# Patient Record
Sex: Male | Born: 1945
Health system: Southern US, Community
[De-identification: ages and names within clinical notes are randomized; demographics above are authoritative.]

## PROBLEM LIST (undated history)

## (undated) DIAGNOSIS — I864 Gastric varices: Secondary | ICD-10-CM

## (undated) DIAGNOSIS — F1911 Other psychoactive substance abuse, in remission: Secondary | ICD-10-CM

## (undated) DIAGNOSIS — Z89619 Acquired absence of unspecified leg above knee: Secondary | ICD-10-CM

## (undated) DIAGNOSIS — G8220 Paraplegia, unspecified: Secondary | ICD-10-CM

## (undated) HISTORY — DX: Acquired absence of unspecified leg above knee: Z89.619

## (undated) HISTORY — DX: Other psychoactive substance abuse, in remission: F19.11

## (undated) HISTORY — DX: Gastric varices: I86.4

## (undated) HISTORY — PX: PENILE PROSTHESIS IMPLANT: SHX240

## (undated) HISTORY — DX: Paraplegia, unspecified: G82.20

## (undated) HISTORY — PX: SPINE SURGERY: SHX786

## (undated) HISTORY — PX: AMPUTATION: SHX166

---

## 2006-06-17 ENCOUNTER — Ambulatory Visit: Payer: Self-pay | Admitting: Family Medicine

## 2006-09-10 ENCOUNTER — Ambulatory Visit: Payer: Self-pay | Admitting: Family Medicine

## 2011-05-07 ENCOUNTER — Ambulatory Visit (INDEPENDENT_AMBULATORY_CARE_PROVIDER_SITE_OTHER): Payer: Medicare Other | Admitting: Surgery

## 2012-11-25 ENCOUNTER — Telehealth: Payer: Self-pay | Admitting: Family Medicine

## 2012-11-25 NOTE — Telephone Encounter (Addendum)
[place] [facility] [place]  He had osteomyelitis and had his L leg amputated and his right 5th toe. Patient is a paraplegic. He has a f/u with the medical center next week.  Hasn't been seen here since 12/2011 and he was a patient of Helene Kelp, PA-C.   He needs 18g French Indwelling Foley Catheter #6 sent to [facility] in [place].  Phone number [phone].

## 2012-11-25 NOTE — Telephone Encounter (Signed)
Unable to send electronically because [facility] in [place] is not listed.  Catheters called in.  They will contact patient.

## 2012-11-25 NOTE — Telephone Encounter (Signed)
Please take care of this catheter needs until he is able to see a provider) in the back

## 2013-01-26 ENCOUNTER — Encounter: Payer: Self-pay | Admitting: Family Medicine

## 2013-01-26 ENCOUNTER — Encounter (INDEPENDENT_AMBULATORY_CARE_PROVIDER_SITE_OTHER): Payer: Self-pay

## 2013-01-26 ENCOUNTER — Ambulatory Visit (INDEPENDENT_AMBULATORY_CARE_PROVIDER_SITE_OTHER): Payer: Medicare Other | Admitting: Family Medicine

## 2013-01-26 VITALS — BP 148/82 | HR 65 | Temp 97.8°F | Ht 71.0 in | Wt 164.0 lb

## 2013-01-26 DIAGNOSIS — B353 Tinea pedis: Secondary | ICD-10-CM

## 2013-01-26 DIAGNOSIS — R319 Hematuria, unspecified: Secondary | ICD-10-CM

## 2013-01-26 LAB — POCT UA - MICROSCOPIC ONLY
Casts, Ur, LPF, POC: NEGATIVE
Mucus, UA: NEGATIVE
Yeast, UA: NEGATIVE

## 2013-01-26 LAB — POCT URINALYSIS DIPSTICK
Bilirubin, UA: NEGATIVE
Glucose, UA: NEGATIVE
Ketones, UA: NEGATIVE
Nitrite, UA: POSITIVE
Protein, UA: NEGATIVE
Spec Grav, UA: <=1.005
Urobilinogen, UA: NEGATIVE
pH, UA: 8

## 2013-01-26 MED ORDER — FLUCONAZOLE 150 MG PO TABS: 150.0000 mg | ORAL_TABLET | Freq: Once | ORAL | Status: DC

## 2013-01-26 MED ORDER — CIPROFLOXACIN HCL 500 MG PO TABS: 500.0000 mg | ORAL_TABLET | Freq: Two times a day (BID) | ORAL | Status: DC

## 2013-01-26 NOTE — Patient Instructions (Signed)
Athlete's Foot Athlete's foot (tinea pedis) is a fungal infection of the skin on the feet. It often occurs on the skin between the toes or underneath the toes. It can also occur on the soles of the feet. Athlete's foot is more likely to occur in hot, humid weather. Not washing your feet or changing your socks often enough can contribute to athlete's foot. The infection can spread from person to person (contagious). CAUSES Athlete's foot is caused by a fungus. This fungus thrives in warm, moist places. Most people get athlete's foot by sharing shower stalls, towels, and wet floors with an infected person. People with weakened immune systems, including those with diabetes, may be more likely to get athlete's foot. SYMPTOMS   Itchy areas between the toes or on the soles of the feet.  White, flaky, or scaly areas between the toes or on the soles of the feet.  Tiny, intensely itchy blisters between the toes or on the soles of the feet.  Tiny cuts on the skin. These cuts can develop a bacterial infection.  Thick or discolored toenails. DIAGNOSIS  Your caregiver can usually tell what the problem is by doing a physical exam. Your caregiver may also take a skin sample from the rash area. The skin sample may be examined under a microscope, or it may be tested to see if fungus will grow in the sample. A sample may also be taken from your toenail for testing. TREATMENT  Over-the-counter and prescription medicines can be used to kill the fungus. These medicines are available as powders or creams. Your caregiver can suggest medicines for you. Fungal infections respond slowly to treatment. You may need to continue using your medicine for several weeks. PREVENTION   Do not share towels.  Wear sandals in wet areas, such as shared locker rooms and shared showers.  Keep your feet dry. Wear shoes that allow air to circulate. Wear cotton or wool socks. HOME CARE INSTRUCTIONS   Take medicines as directed by  your caregiver. Do not use steroid creams on athlete's foot.  Keep your feet clean and cool. Wash your feet daily and dry them thoroughly, especially between your toes.  Change your socks every day. Wear cotton or wool socks. In hot climates, you may need to change your socks 2 to 3 times per day.  Wear sandals or canvas tennis shoes with good air circulation.  If you have blisters, soak your feet in Burow's solution or Epsom salts for 20 to 30 minutes, 2 times a day to dry out the blisters. Make sure you dry your feet thoroughly afterward. SEEK MEDICAL CARE IF:   You have a fever.  You have swelling, soreness, warmth, or redness in your foot.  You are not getting better after 7 days of treatment.  You are not completely cured after 30 days.  You have any problems caused by your medicines. MAKE SURE YOU:   Understand these instructions.  Will watch your condition.  Will get help right away if you are not doing well or get worse. Document Released: 03/22/2000 Document Revised: 06/17/2011 Document Reviewed: 01/11/2011 Exit[employer] Patient Information 2014 [employer].  

## 2013-01-26 NOTE — Progress Notes (Signed)
  Subjective:    Patient ID: Jason Clayton, male    DOB: [birth year], 67 y.o.   MRN: [id number]  HPI  This 67 y.o. male presents for evaluation of possible UTI and left foot wound at his 4th and 3rd toe. He is scheduled to see a podiatrist with the [place] in [place].  He has indwelling foley cath.  He has  Hx of paraplegia.  He has had AKA left LE.  He has hx of neurogenic bladder and was using condom Cath and then went to foley and he changes the foley and leg bag once a week.  He has had hx Of decubitus ulcers and has been operated on 7 times over the years.  Review of Systems C/o left foot wound and urinary sx's.   No chest pain, SOB, HA, dizziness, vision change, N/V, diarrhea, constipation or rash.  Objective:   Physical Exam Vital signs noted  Well developed well nourished male.  HEENT - Head atraumatic Normocephalic                Eyes - PERRLA, Conjuctiva - clear Sclera- Clear EOMI                Ears - EAC's Wnl TM's Wnl Gross Hearing WNL                Nose - Nares patent                 Throat - oropharanx wnl Respiratory - Lungs CTA bilateral Cardiac - RRR S1 and S2 without murmur GI - Abdomen soft Nontender and bowel sounds active x 4 Extremities - No edema. Neuro - Grossly intact.  Results for orders placed in visit on 01/26/13  POCT UA - MICROSCOPIC ONLY      Result Value Range   WBC, Ur, HPF, POC 10-15     RBC, urine, microscopic 1-5     Bacteria, U Microscopic mod     Mucus, UA neg     Epithelial cells, urine per micros occ     Crystals, Ur, HPF, POC mod     Casts, Ur, LPF, POC neg     Yeast, UA neg    POCT URINALYSIS DIPSTICK      Result Value Range   Color, UA yellow     Clarity, UA cloudy     Glucose, UA neg     Bilirubin, UA neg     Ketones, UA neg     Spec Grav, UA <=1.005     Blood, UA trace     pH, UA 8.0     Protein, UA neg     Urobilinogen, UA negative     Nitrite, UA pos     Leukocytes, UA large (3+)         Assessment & Plan:   Blood in urine - Plan: POCT UA - Microscopic Only, POCT urinalysis dipstick, ciprofloxacin (CIPRO) 500 MG tablet  Tinea pedis - Plan: fluconazole (DIFLUCAN) 150 MG tablet  Deatra Canter FNP

## 2013-01-29 ENCOUNTER — Telehealth: Payer: Self-pay | Admitting: Family Medicine

## 2013-02-02 ENCOUNTER — Telehealth: Payer: Self-pay | Admitting: Family Medicine

## 2013-02-02 ENCOUNTER — Other Ambulatory Visit: Payer: Self-pay | Admitting: Family Medicine

## 2013-02-02 DIAGNOSIS — B353 Tinea pedis: Secondary | ICD-10-CM

## 2013-02-02 MED ORDER — FLUCONAZOLE 150 MG PO TABS: 150.0000 mg | ORAL_TABLET | Freq: Once | ORAL | Status: DC

## 2013-02-17 NOTE — Telephone Encounter (Signed)
Spoke with pt and he says "they need not send specimen off for culture but he did complete cipro and now reports he is not having any problems. Advised to call if symptoms occur. Pt verbalized understanidng

## 2013-02-23 ENCOUNTER — Encounter: Payer: Self-pay | Admitting: Family Medicine

## 2013-02-23 ENCOUNTER — Ambulatory Visit (INDEPENDENT_AMBULATORY_CARE_PROVIDER_SITE_OTHER): Payer: Medicare Other | Admitting: Family Medicine

## 2013-02-23 VITALS — BP 146/86 | HR 83 | Temp 97.8°F | Ht 71.0 in | Wt 160.0 lb

## 2013-02-23 DIAGNOSIS — J329 Chronic sinusitis, unspecified: Secondary | ICD-10-CM

## 2013-02-23 DIAGNOSIS — J209 Acute bronchitis, unspecified: Secondary | ICD-10-CM

## 2013-02-23 MED ORDER — AMOXICILLIN-POT CLAVULANATE 875-125 MG PO TABS: 1.0000 | ORAL_TABLET | Freq: Two times a day (BID) | ORAL | Status: DC

## 2013-02-23 MED ORDER — HYDROCODONE-HOMATROPINE 5-1.5 MG/5ML PO SYRP: 5.0000 mL | ORAL_SOLUTION | Freq: Four times a day (QID) | ORAL | Status: DC | PRN

## 2013-02-23 MED ORDER — METHYLPREDNISOLONE (PAK) 4 MG PO TABS: ORAL_TABLET | ORAL | Status: DC

## 2013-02-23 NOTE — Patient Instructions (Signed)

## 2013-02-23 NOTE — Progress Notes (Signed)
  Subjective:    Patient ID: COBE VINEY, male    DOB: 1946/03/27, 67 y.o.   MRN: [id number]  URI  This is a new problem. The current episode started 1 to 4 weeks ago (for last week and half). The problem has been gradually worsening. There has been no fever. The fever has been present for less than 1 day. Associated symptoms include congestion, coughing, headaches, rhinorrhea and sneezing. Pertinent negatives include no nausea, plugged ear sensation, sinus pain or wheezing. He has tried decongestant, antihistamine and NSAIDs for the symptoms. The treatment provided mild relief.      Review of Systems  HENT: Positive for congestion, rhinorrhea and sneezing.   Respiratory: Positive for cough. Negative for wheezing.   Gastrointestinal: Negative for nausea.  Neurological: Positive for headaches.  All other systems reviewed and are negative.       Objective:   Physical Exam  Vitals reviewed. Constitutional: He is oriented to person, place, and time. He appears well-developed and well-nourished.  HENT:  Right Ear: External ear normal.  Left Ear: External ear normal.  Nose: Rhinorrhea present. Right sinus exhibits no maxillary sinus tenderness and no frontal sinus tenderness. Left sinus exhibits no maxillary sinus tenderness and no frontal sinus tenderness.  Eyes: Pupils are equal, round, and reactive to light.  Cardiovascular: Normal rate, regular rhythm, normal heart sounds and intact distal pulses.   Pulmonary/Chest: Effort normal. He has wheezes.  Productive coarse cough  Neurological: He is alert and oriented to person, place, and time.  Psychiatric: He has a normal mood and affect. His behavior is normal. Judgment and thought content normal.      BP 146/86  Pulse 83  Temp(Src) 97.8 F (36.6 C) (Oral)  Ht 5\' 11"  (1.803 m)  Wt 160 lb (72.576 kg)  BMI 22.33 kg/m2     Assessment & Plan:  Sinusitis - Plan: amoxicillin-clavulanate (AUGMENTIN) 875-125 MG per tablet,  methylPREDNIsolone (MEDROL DOSPACK) 4 MG tablet, HYDROcodone-homatropine (HYCODAN) 5-1.5 MG/5ML syrup  Acute bronchitis - Plan: amoxicillin-clavulanate (AUGMENTIN) 875-125 MG per tablet, methylPREDNIsolone (MEDROL DOSPACK) 4 MG tablet, HYDROcodone-homatropine (HYCODAN) 5-1.5 MG/5ML syrup.   Deatra Canter FNP

## 2013-03-18 ENCOUNTER — Ambulatory Visit (INDEPENDENT_AMBULATORY_CARE_PROVIDER_SITE_OTHER): Payer: Medicare Other | Admitting: Family Medicine

## 2013-03-18 ENCOUNTER — Encounter: Payer: Self-pay | Admitting: Family Medicine

## 2013-03-18 ENCOUNTER — Telehealth: Payer: Self-pay | Admitting: Family Medicine

## 2013-03-18 VITALS — BP 147/83 | HR 61 | Temp 98.5°F

## 2013-03-18 DIAGNOSIS — R319 Hematuria, unspecified: Secondary | ICD-10-CM

## 2013-03-18 DIAGNOSIS — R109 Unspecified abdominal pain: Secondary | ICD-10-CM

## 2013-03-18 MED ORDER — SULFAMETHOXAZOLE-TMP DS 800-160 MG PO TABS: 1.0000 | ORAL_TABLET | Freq: Two times a day (BID) | ORAL | Status: DC

## 2013-03-18 MED ORDER — HYOSCYAMINE SULFATE 0.125 MG SL SUBL: 0.1250 mg | SUBLINGUAL_TABLET | SUBLINGUAL | Status: DC | PRN

## 2013-03-18 NOTE — Progress Notes (Signed)
   Subjective:    Patient ID: Jason Clayton, male    DOB: [birth year], 67 y.o.   MRN: [id number]  HPI This 67 y.o. male presents for evaluation of abdominal pain and bowel spasms.  He has Hx of paraplegia and left AKA.  He has been doing self digital stimulation to have BM and he Notes his stools are firm.  He states he has been having lower abdominal discomfort.  He Has indwelling foley catheter and has been having some change in his urine which Now is cloudy and has sediment in it.  He has been feeling lethargic.  He has an ulcer on His right 4th and 3rd toes and he is seeing podiatry at the [place] in [place].   Review of Systems C/o constipation, lower abdominal discomfort, and lethargy. No chest pain, SOB, HA, dizziness, vision change, N/V, diarrhea, constipation, dysuria, urinary urgency or frequency, myalgias, arthralgias or rash.     Objective:   Physical Exam  Vital signs noted  Well developed well nourished male.  HEENT - Head atraumatic Normocephalic                Eyes - PERRLA, Conjuctiva - clear Sclera- Clear EOMI                Ears - EAC's Wnl TM's Wnl Gross Hearing WNL                Throat - oropharanx wnl Respiratory - Lungs CTA bilateral Cardiac - RRR S1 and S2 without murmur GI - Abdomen soft tenderness at umbilicus but no guarding and bowel sounds active x 4 Extremities - Left AKA       Assessment & Plan:  Blood in urine - Plan: sulfamethoxazole-trimethoprim (BACTRIM DS) 800-160 MG per tablet  Abdominal  pain, other specified site - Plan: hyoscyamine (LEVSIN/SL) 0.125 MG SL tablet  Constipation - Linzess samples given  Deatra Canter FNP

## 2013-03-18 NOTE — Telephone Encounter (Signed)
appt scheduled

## 2013-03-18 NOTE — Patient Instructions (Signed)

## 2013-03-19 ENCOUNTER — Ambulatory Visit: Payer: Medicare Other | Admitting: Family Medicine

## 2013-04-12 DIAGNOSIS — T1490XA Injury, unspecified, initial encounter: Secondary | ICD-10-CM | POA: Diagnosis not present

## 2013-05-07 ENCOUNTER — Encounter: Payer: Self-pay | Admitting: Family Medicine

## 2013-05-07 ENCOUNTER — Ambulatory Visit (INDEPENDENT_AMBULATORY_CARE_PROVIDER_SITE_OTHER): Payer: Medicare Other | Admitting: Family Medicine

## 2013-05-07 VITALS — BP 128/70 | HR 64 | Temp 97.8°F | Ht 71.0 in

## 2013-05-07 DIAGNOSIS — G8929 Other chronic pain: Secondary | ICD-10-CM

## 2013-05-07 DIAGNOSIS — R1013 Epigastric pain: Secondary | ICD-10-CM

## 2013-05-07 MED ORDER — SUCRALFATE 1 G PO TABS: 1.0000 g | ORAL_TABLET | Freq: Three times a day (TID) | ORAL | Status: DC

## 2013-05-07 MED ORDER — SUCRALFATE 1 GM/10ML PO SUSP: 1.0000 g | Freq: Three times a day (TID) | ORAL | Status: DC

## 2013-05-07 NOTE — Patient Instructions (Signed)

## 2013-05-07 NOTE — Progress Notes (Signed)
   Subjective:    Patient ID: Jason Clayton, male    DOB: 05/06/1945, 68 y.o.   MRN: [id number]  HPI This 68 y.o. male presents for evaluation of abdominal pain.  He has been taking xtra Methadone which is rx[facility] and he wants me to give him some more to bridge Until next week.   Review of Systems C/o abdominal pain No chest pain, SOB, HA, dizziness, vision change, N/V, diarrhea, constipation, dysuria, urinary urgency or frequency, myalgias, arthralgias or rash.     Objective:   Physical Exam Vital signs noted  Well developed well nourished male.  HEENT - Head atraumatic Normocephalic                Eyes - PERRLA, Conjuctiva - clear Sclera- Clear EOMI                Ears - EAC's Wnl TM's Wnl Gross Hearing WNL                Nose - Nares patent                 Throat - oropharanx wnl Respiratory - Lungs CTA bilateral Cardiac - RRR S1 and S2 without murmur GI - Abdomen soft tender epigastric region and bowel sounds active x 4 Extremities - Left AKA       Assessment & Plan:  Abdominal pain, chronic, epigastric - Plan: sucralfate (CARAFATE) 1 G tablet Po ac and hs x 2 weeks.  Denied methadone and explained he needs to go to his pain Management doctor who rx's this and does not need to have multiple prescribers of pain medicine.  Deatra CanterWilliam J Oxford FNP

## 2013-05-20 ENCOUNTER — Encounter: Payer: Self-pay | Admitting: Family Medicine

## 2013-05-20 ENCOUNTER — Ambulatory Visit (INDEPENDENT_AMBULATORY_CARE_PROVIDER_SITE_OTHER): Payer: Medicare Other | Admitting: Family Medicine

## 2013-05-20 ENCOUNTER — Encounter (INDEPENDENT_AMBULATORY_CARE_PROVIDER_SITE_OTHER): Payer: Self-pay

## 2013-05-20 VITALS — BP 128/73 | HR 68 | Temp 99.1°F

## 2013-05-20 DIAGNOSIS — R1013 Epigastric pain: Secondary | ICD-10-CM

## 2013-05-20 DIAGNOSIS — K219 Gastro-esophageal reflux disease without esophagitis: Secondary | ICD-10-CM | POA: Diagnosis not present

## 2013-05-20 MED ORDER — CLARITHROMYCIN 500 MG PO TABS: 500.0000 mg | ORAL_TABLET | Freq: Two times a day (BID) | ORAL | Status: DC

## 2013-05-20 MED ORDER — AMOXICILLIN 500 MG PO CAPS: ORAL_CAPSULE | ORAL | Status: DC

## 2013-05-20 NOTE — Progress Notes (Signed)
   Subjective:    Patient ID: Jason Clayton, male    DOB: 04/13/1945, 68 y.o.   MRN: [id number]  HPI This 68 y.o. male presents for evaluation of GERD sx's.  He has hx of h-pylori and his GERD is acting up.  He has hx of paraplegia and has left AKA.  He needs a DMV form filled out.  He has been taking Omeprazole and he has breakthrough GERD sx's.  He has been taking carafate and this helps some But his GERD returns.  He has not been tx'd for H-Pylori in over a year.  He states when he did the first tx he was better and now the sx's have returned.   Review of Systems C/o gerd and abdominal discomfort No chest pain, SOB, HA, dizziness, vision change, N/V, diarrhea, constipation, dysuria, urinary urgency or frequency, myalgias, arthralgias or rash.     Objective:   Physical Exam Vital signs noted  Well developed well nourished male.  HEENT - Head atraumatic Normocephalic                Eyes - PERRLA, Conjuctiva - clear Sclera- Clear EOMI                Ears - EAC's Wnl TM's Wnl Gross Hearing WNL                Throat - oropharanx wnl Respiratory - Lungs CTA bilateral Cardiac - RRR S1 and S2 without murmur GI - Abdomen soft Nontender and bowel sounds active x 4 Extremities - He has left AKA.      Assessment & Plan:  GERD (gastroesophageal reflux disease) - Plan: clarithromycin (BIAXIN) 500 MG tablet, amoxicillin (AMOXIL) 500 MG capsule  Abdominal pain, epigastric - Plan: clarithromycin (BIAXIN) 500 MG tablet, amoxicillin (AMOXIL) 500 MG capsule x 14 days  Parplegia - [facility]  Left AKA - Ok to drive with hand controllers.  Deatra CanterWilliam J Oxford FNP

## 2013-05-20 NOTE — Patient Instructions (Signed)

## 2013-06-16 ENCOUNTER — Ambulatory Visit: Payer: Medicare Other | Admitting: Family Medicine

## 2013-06-21 ENCOUNTER — Ambulatory Visit (INDEPENDENT_AMBULATORY_CARE_PROVIDER_SITE_OTHER): Payer: Medicare Other | Admitting: Family Medicine

## 2013-06-21 ENCOUNTER — Encounter: Payer: Self-pay | Admitting: Family Medicine

## 2013-06-21 ENCOUNTER — Telehealth: Payer: Self-pay | Admitting: Family Medicine

## 2013-06-21 VITALS — BP 136/76 | HR 69 | Temp 98.1°F | Ht 71.0 in

## 2013-06-21 DIAGNOSIS — G589 Mononeuropathy, unspecified: Secondary | ICD-10-CM | POA: Diagnosis not present

## 2013-06-21 DIAGNOSIS — N390 Urinary tract infection, site not specified: Secondary | ICD-10-CM

## 2013-06-21 DIAGNOSIS — R109 Unspecified abdominal pain: Secondary | ICD-10-CM

## 2013-06-21 DIAGNOSIS — G629 Polyneuropathy, unspecified: Secondary | ICD-10-CM | POA: Insufficient documentation

## 2013-06-21 DIAGNOSIS — K219 Gastro-esophageal reflux disease without esophagitis: Secondary | ICD-10-CM | POA: Insufficient documentation

## 2013-06-21 LAB — POCT CBC
GRANULOCYTE PERCENT: 58.1 % (ref 37–80)
HEMATOCRIT: 46.3 % (ref 43.5–53.7)
Hemoglobin: 14.8 g/dL (ref 14.1–18.1)
Lymph, poc: 3.3 (ref 0.6–3.4)
MCH, POC: 28.6 pg (ref 27–31.2)
MCHC: 32.1 g/dL (ref 31.8–35.4)
MCV: 89.3 fL (ref 80–97)
MPV: 8 fL (ref 0–99.8)
PLATELET COUNT, POC: 190 10*3/uL (ref 142–424)
POC Granulocyte: 5.1 (ref 2–6.9)
POC LYMPH PERCENT: 37.8 %L (ref 10–50)
RBC: 5.2 M/uL (ref 4.69–6.13)
RDW, POC: 13.4 %
WBC: 8.8 10*3/uL (ref 4.6–10.2)

## 2013-06-21 LAB — POCT UA - MICROSCOPIC ONLY
Casts, Ur, LPF, POC: NEGATIVE
Crystals, Ur, HPF, POC: NEGATIVE
Mucus, UA: NEGATIVE
Yeast, UA: NEGATIVE

## 2013-06-21 LAB — POCT URINALYSIS DIPSTICK
BILIRUBIN UA: NEGATIVE
Glucose, UA: NEGATIVE
Ketones, UA: NEGATIVE
Nitrite, UA: NEGATIVE
Spec Grav, UA: <=1.005
Urobilinogen, UA: NEGATIVE
pH, UA: 7

## 2013-06-21 MED ORDER — SULFAMETHOXAZOLE-TMP DS 800-160 MG PO TABS: 1.0000 | ORAL_TABLET | Freq: Two times a day (BID) | ORAL | Status: DC

## 2013-06-21 NOTE — Telephone Encounter (Signed)
Appt given for today 
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
## 2013-06-21 NOTE — Patient Instructions (Signed)
Drink plenty of fluids Take antibiotic as directed Increase omeprazole and take this medication twice daily before breakfast and supper We will call you when the lab work results will come available We will try to obtain copies of any x-rays that you have had done of your abdomen

## 2013-06-22 LAB — BMP8+EGFR
BUN/Creatinine Ratio: 21 (ref 10–22)
BUN: 16 mg/dL (ref 8–27)
CHLORIDE: 102 mmol/L (ref 97–108)
CO2: 24 mmol/L (ref 18–29)
Calcium: 9.3 mg/dL (ref 8.6–10.2)
Creatinine, Ser: 0.76 mg/dL (ref 0.76–1.27)
GFR calc non Af Amer: 96 mL/min/{1.73_m2} (ref >59)
GFR, EST AFRICAN AMERICAN: 111 mL/min/{1.73_m2} (ref >59)
GLUCOSE: 80 mg/dL (ref 65–99)
Potassium: 4.6 mmol/L (ref 3.5–5.2)
Sodium: 142 mmol/L (ref 134–144)

## 2013-06-23 ENCOUNTER — Telehealth: Payer: Self-pay | Admitting: Family Medicine

## 2013-06-24 LAB — URINE CULTURE

## 2013-06-24 NOTE — Telephone Encounter (Signed)
Fax [phone] Attn: Ms. Ethelene Brownsnthony  Discussed results.  Results and office notes fax[facility] to [facility].

## 2013-07-01 DIAGNOSIS — H269 Unspecified cataract: Secondary | ICD-10-CM | POA: Diagnosis not present

## 2013-07-01 DIAGNOSIS — H5200 Hypermetropia, unspecified eye: Secondary | ICD-10-CM | POA: Diagnosis not present

## 2013-07-05 ENCOUNTER — Telehealth: Payer: Self-pay | Admitting: Family Medicine

## 2013-07-05 DIAGNOSIS — Z79899 Other long term (current) drug therapy: Secondary | ICD-10-CM | POA: Diagnosis not present

## 2013-07-05 DIAGNOSIS — R109 Unspecified abdominal pain: Secondary | ICD-10-CM | POA: Diagnosis not present

## 2013-07-05 DIAGNOSIS — G8220 Paraplegia, unspecified: Secondary | ICD-10-CM | POA: Diagnosis not present

## 2013-07-05 DIAGNOSIS — Z87440 Personal history of urinary (tract) infections: Secondary | ICD-10-CM | POA: Diagnosis not present

## 2013-07-05 DIAGNOSIS — R0602 Shortness of breath: Secondary | ICD-10-CM | POA: Diagnosis not present

## 2013-07-05 DIAGNOSIS — K5900 Constipation, unspecified: Secondary | ICD-10-CM | POA: Diagnosis not present

## 2013-07-05 DIAGNOSIS — R112 Nausea with vomiting, unspecified: Secondary | ICD-10-CM | POA: Diagnosis not present

## 2013-07-05 DIAGNOSIS — F17200 Nicotine dependence, unspecified, uncomplicated: Secondary | ICD-10-CM | POA: Diagnosis not present

## 2013-07-05 DIAGNOSIS — K560 Paralytic ileus: Secondary | ICD-10-CM | POA: Diagnosis not present

## 2013-07-05 NOTE — Telephone Encounter (Signed)
Pt having vomiting everytime he eats and rectal bleeding. Advised pt to go to ER now. Verbalized understanding.

## 2014-07-14 DIAGNOSIS — H2513 Age-related nuclear cataract, bilateral: Secondary | ICD-10-CM | POA: Diagnosis not present

## 2014-07-14 DIAGNOSIS — H5203 Hypermetropia, bilateral: Secondary | ICD-10-CM | POA: Diagnosis not present

## 2014-07-14 DIAGNOSIS — H52223 Regular astigmatism, bilateral: Secondary | ICD-10-CM | POA: Diagnosis not present

## 2014-07-14 DIAGNOSIS — H00029 Hordeolum internum unspecified eye, unspecified eyelid: Secondary | ICD-10-CM | POA: Diagnosis not present

## 2014-09-14 ENCOUNTER — Encounter: Payer: Self-pay | Admitting: Physician Assistant

## 2014-09-14 ENCOUNTER — Ambulatory Visit (INDEPENDENT_AMBULATORY_CARE_PROVIDER_SITE_OTHER): Payer: Medicare Other | Admitting: Physician Assistant

## 2014-09-14 VITALS — BP 116/72 | HR 75 | Temp 98.3°F

## 2014-09-14 DIAGNOSIS — G8220 Paraplegia, unspecified: Secondary | ICD-10-CM | POA: Diagnosis not present

## 2014-09-14 NOTE — Progress Notes (Signed)
Subjective:     Patient ID: Jason Clayton, male   DOB: 08/24/1945, 69 y.o.   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

## 2015-01-31 DIAGNOSIS — G8220 Paraplegia, unspecified: Secondary | ICD-10-CM | POA: Diagnosis not present

## 2015-01-31 DIAGNOSIS — T2126XA Burn of second degree of male genital region, initial encounter: Secondary | ICD-10-CM | POA: Diagnosis not present

## 2015-01-31 DIAGNOSIS — X101XXA Contact with hot food, initial encounter: Secondary | ICD-10-CM | POA: Diagnosis not present

## 2015-03-17 DIAGNOSIS — N5089 Other specified disorders of the male genital organs: Secondary | ICD-10-CM | POA: Diagnosis not present

## 2015-03-17 DIAGNOSIS — Z79891 Long term (current) use of opiate analgesic: Secondary | ICD-10-CM | POA: Diagnosis not present

## 2015-03-17 DIAGNOSIS — F17210 Nicotine dependence, cigarettes, uncomplicated: Secondary | ICD-10-CM | POA: Diagnosis not present

## 2015-03-17 DIAGNOSIS — N433 Hydrocele, unspecified: Secondary | ICD-10-CM | POA: Diagnosis not present

## 2015-03-17 DIAGNOSIS — N319 Neuromuscular dysfunction of bladder, unspecified: Secondary | ICD-10-CM | POA: Diagnosis not present

## 2015-03-17 DIAGNOSIS — Z452 Encounter for adjustment and management of vascular access device: Secondary | ICD-10-CM | POA: Diagnosis not present

## 2015-03-17 DIAGNOSIS — G8221 Paraplegia, complete: Secondary | ICD-10-CM | POA: Diagnosis not present

## 2015-03-17 DIAGNOSIS — Z89612 Acquired absence of left leg above knee: Secondary | ICD-10-CM | POA: Diagnosis not present

## 2015-03-22 DIAGNOSIS — G8221 Paraplegia, complete: Secondary | ICD-10-CM | POA: Diagnosis not present

## 2015-03-22 DIAGNOSIS — N433 Hydrocele, unspecified: Secondary | ICD-10-CM | POA: Diagnosis not present

## 2015-03-22 DIAGNOSIS — Z452 Encounter for adjustment and management of vascular access device: Secondary | ICD-10-CM | POA: Diagnosis not present

## 2015-03-22 DIAGNOSIS — N319 Neuromuscular dysfunction of bladder, unspecified: Secondary | ICD-10-CM | POA: Diagnosis not present

## 2015-03-22 DIAGNOSIS — F17210 Nicotine dependence, cigarettes, uncomplicated: Secondary | ICD-10-CM | POA: Diagnosis not present

## 2015-03-22 DIAGNOSIS — N5089 Other specified disorders of the male genital organs: Secondary | ICD-10-CM | POA: Diagnosis not present

## 2015-03-30 DIAGNOSIS — F17210 Nicotine dependence, cigarettes, uncomplicated: Secondary | ICD-10-CM | POA: Diagnosis not present

## 2015-03-30 DIAGNOSIS — N5089 Other specified disorders of the male genital organs: Secondary | ICD-10-CM | POA: Diagnosis not present

## 2015-03-30 DIAGNOSIS — G8221 Paraplegia, complete: Secondary | ICD-10-CM | POA: Diagnosis not present

## 2015-03-30 DIAGNOSIS — N433 Hydrocele, unspecified: Secondary | ICD-10-CM | POA: Diagnosis not present

## 2015-03-30 DIAGNOSIS — Z452 Encounter for adjustment and management of vascular access device: Secondary | ICD-10-CM | POA: Diagnosis not present

## 2015-03-30 DIAGNOSIS — N319 Neuromuscular dysfunction of bladder, unspecified: Secondary | ICD-10-CM | POA: Diagnosis not present

## 2015-05-29 ENCOUNTER — Ambulatory Visit (INDEPENDENT_AMBULATORY_CARE_PROVIDER_SITE_OTHER): Payer: Medicare Other | Admitting: Family

## 2015-05-29 ENCOUNTER — Encounter: Payer: Self-pay | Admitting: Family

## 2015-05-29 VITALS — BP 114/73 | HR 75 | Temp 98.0°F

## 2015-05-29 DIAGNOSIS — J209 Acute bronchitis, unspecified: Secondary | ICD-10-CM | POA: Diagnosis not present

## 2015-05-29 MED ORDER — ALBUTEROL SULFATE HFA 108 (90 BASE) MCG/ACT IN AERS: 2.0000 | INHALATION_SPRAY | Freq: Four times a day (QID) | RESPIRATORY_TRACT | Status: DC | PRN

## 2015-05-29 MED ORDER — ALBUTEROL SULFATE (2.5 MG/3ML) 0.083% IN NEBU: 2.5000 mg | INHALATION_SOLUTION | Freq: Four times a day (QID) | RESPIRATORY_TRACT | Status: DC | PRN

## 2015-05-29 MED ORDER — PREDNISONE 20 MG PO TABS: ORAL_TABLET | ORAL | Status: DC

## 2015-05-29 MED ORDER — LEVOFLOXACIN 500 MG PO TABS: 500.0000 mg | ORAL_TABLET | Freq: Every day | ORAL | Status: DC

## 2015-05-29 NOTE — Progress Notes (Signed)
Subjective:    Patient ID: Jason Clayton, male    DOB: May 23, 1945, 70 y.o.   MRN: [id number]  Shortness of Breath Associated symptoms include a sore throat and wheezing. Pertinent negatives include no ear pain, fever or headaches. There is no history of asthma or COPD.  Cough This is a new problem. The current episode started in the past 7 days. The problem has been gradually worsening. The cough is productive of purulent sputum. Associated symptoms include nasal congestion, postnasal drip, a sore throat, shortness of breath and wheezing. Pertinent negatives include no chills, ear congestion, ear pain, fever, headaches or myalgias. The symptoms are aggravated by lying down. He has tried OTC cough suppressant and rest for the symptoms. The treatment provided mild relief. There is no history of asthma or COPD.      Review of Systems  Constitutional: Negative.  Negative for fever and chills.  HENT: Positive for postnasal drip and sore throat. Negative for ear pain.   Respiratory: Positive for cough, shortness of breath and wheezing.   Cardiovascular: Negative.   Gastrointestinal: Negative.   Endocrine: Negative.   Genitourinary: Negative.   Musculoskeletal: Negative.  Negative for myalgias.  Neurological: Negative.  Negative for headaches.  Hematological: Negative.   Psychiatric/Behavioral: Negative.   All other systems reviewed and are negative.      Objective:   Physical Exam  Constitutional: He is oriented to person, place, and time. He appears well-developed and well-nourished. No distress.  HENT:  Head: Normocephalic.  Right Ear: External ear normal.  Left Ear: External ear normal.  Mouth/Throat: Oropharynx is clear and moist.  Nasal passage erythemas with mild swelling    Eyes: Pupils are equal, round, and reactive to light. Right eye exhibits no discharge. Left eye exhibits no discharge.  Neck: Normal range of motion. Neck supple. No thyromegaly present.    Cardiovascular: Normal rate, regular rhythm, normal heart sounds and intact distal pulses.   No murmur heard. Pulmonary/Chest: Effort normal. No respiratory distress. He has wheezes.  Abdominal: Soft. Bowel sounds are normal. He exhibits no distension. There is no tenderness.  Musculoskeletal: Normal range of motion. He exhibits no edema or tenderness.  Left BKA   Neurological: He is alert and oriented to person, place, and time. He has normal reflexes. No cranial nerve deficit.  Skin: Skin is warm and dry. No rash noted. No erythema.  Psychiatric: He has a normal mood and affect. His behavior is normal. Judgment and thought content normal.  Vitals reviewed.     BP 114/73 mmHg  Pulse 75  Temp(Src) 98 F (36.7 C) (Oral)  Ht   Wt   SpO2 96%     Assessment & Plan:  1. Acute bronchitis, unspecified organism -- Take meds as prescribed - Use a cool mist humidifier  -Use saline nose sprays frequently -Saline irrigations of the nose can be very helpful if done frequently.  * 4X daily for 1 week*  * Use of a nettie pot can be helpful with this. Follow directions with this* -Force fluids -For any cough or congestion  Use plain Mucinex- regular strength or max strength is fine   * Children- consult with Pharmacist for dosing -For fever or aces or pains- take tylenol or ibuprofen appropriate for age and weight.  * for fevers greater than 101 orally you may alternate ibuprofen and tylenol every  3 hours. -Throat lozenges if help -RTO in 2 weeks - levofloxacin (LEVAQUIN) 500 MG tablet; Take 1 tablet (500  mg total) by mouth daily.  Dispense: 7 tablet; Refill: 0 - albuterol (PROVENTIL HFA;VENTOLIN HFA) 108 (90 Base) MCG/ACT inhaler; Inhale 2 puffs into the lungs every 6 (six) hours as needed for wheezing or shortness of breath.  Dispense: 1 Inhaler; Refill: 2 - albuterol (PROVENTIL) (2.5 MG/3ML) 0.083% nebulizer solution; Take 3 mLs (2.5 mg total) by nebulization every 6 (six) hours as  needed for wheezing or shortness of breath.  Dispense: 150 mL; Refill: 1 - DME Nebulizer machine - predniSONE (DELTASONE) 20 MG tablet; Take 3 tabs daily for one week, then 2 tabs for one 1 weeks, then 1 tab for one week  Dispense: 42 tablet; Refill: 0  Jannifer Rodney, FNP

## 2015-05-29 NOTE — Patient Instructions (Addendum)
Acute Bronchitis Bronchitis is inflammation of the airways that extend from the windpipe into the lungs (bronchi). The inflammation often causes mucus to develop. This leads to a cough, which is the most common symptom of bronchitis.  In acute bronchitis, the condition usually develops suddenly and goes away over time, usually in a couple weeks. Smoking, allergies, and asthma can make bronchitis worse. Repeated episodes of bronchitis may cause further lung problems.  CAUSES Acute bronchitis is most often caused by the same virus that causes a cold. The virus can spread from person to person (contagious) through coughing, sneezing, and touching contaminated objects. SIGNS AND SYMPTOMS   Cough.   Fever.   Coughing up mucus.   Body aches.   Chest congestion.   Chills.   Shortness of breath.   Sore throat.  DIAGNOSIS  Acute bronchitis is usually diagnosed through a physical exam. Your health care provider will also ask you questions about your medical history. Tests, such as chest X-rays, are sometimes done to rule out other conditions.  TREATMENT  Acute bronchitis usually goes away in a couple weeks. Oftentimes, no medical treatment is necessary. Medicines are sometimes given for relief of fever or cough. Antibiotic medicines are usually not needed but may be prescribed in certain situations. In some cases, an inhaler may be recommended to help reduce shortness of breath and control the cough. A cool mist vaporizer may also be used to help thin bronchial secretions and make it easier to clear the chest.  HOME CARE INSTRUCTIONS  Get plenty of rest.   Drink enough fluids to keep your urine clear or pale yellow (unless you have a medical condition that requires fluid restriction). Increasing fluids may help thin your respiratory secretions (sputum) and reduce chest congestion, and it will prevent dehydration.   Take medicines only as directed by your health care provider.  If  you were prescribed an antibiotic medicine, finish it all even if you start to feel better.  Avoid smoking and secondhand smoke. Exposure to cigarette smoke or irritating chemicals will make bronchitis worse. If you are a smoker, consider using nicotine gum or skin patches to help control withdrawal symptoms. Quitting smoking will help your lungs heal faster.   Reduce the chances of another bout of acute bronchitis by washing your hands frequently, avoiding people with cold symptoms, and trying not to touch your hands to your mouth, nose, or eyes.   Keep all follow-up visits as directed by your health care provider.  SEEK MEDICAL CARE IF: Your symptoms do not improve after 1 week of treatment.  SEEK IMMEDIATE MEDICAL CARE IF:  You develop an increased fever or chills.   You have chest pain.   You have severe shortness of breath.  You have bloody sputum.   You develop dehydration.  You faint or repeatedly feel like you are going to pass out.  You develop repeated vomiting.  You develop a severe headache. MAKE SURE YOU:   Understand these instructions.  Will watch your condition.  Will get help right away if you are not doing well or get worse.   This information is not intended to replace advice given to you by your health care provider. Make sure you discuss any questions you have with your health care provider.   Document Released: 05/02/2004 Document Revised: 04/15/2014 Document Reviewed: 09/15/2012 Elsevier Interactive Patient Education 2016 [employer]  - Take meds as prescribed - Use a cool mist humidifier  -Use saline nose sprays frequently -Saline   irrigations of the nose can be very helpful if done frequently.  * 4X daily for 1 week*  * Use of a nettie pot can be helpful with this. Follow directions with this* -Force fluids -For any cough or congestion  Use plain Mucinex- regular strength or max strength is fine   * Children- consult with Pharmacist for  dosing -For fever or aces or pains- take tylenol or ibuprofen appropriate for age and weight.  * for fevers greater than 101 orally you may alternate ibuprofen and tylenol every  3 hours. -Throat lozenges if help    Christy Hawks, FNP  

## 2015-06-12 ENCOUNTER — Ambulatory Visit (INDEPENDENT_AMBULATORY_CARE_PROVIDER_SITE_OTHER): Payer: Medicare Other

## 2015-06-12 ENCOUNTER — Encounter: Payer: Self-pay | Admitting: Family

## 2015-06-12 ENCOUNTER — Ambulatory Visit (INDEPENDENT_AMBULATORY_CARE_PROVIDER_SITE_OTHER): Payer: Medicare Other | Admitting: Family

## 2015-06-12 VITALS — BP 128/66 | HR 60 | Temp 98.0°F

## 2015-06-12 DIAGNOSIS — S82201A Unspecified fracture of shaft of right tibia, initial encounter for closed fracture: Secondary | ICD-10-CM

## 2015-06-12 DIAGNOSIS — S8991XA Unspecified injury of right lower leg, initial encounter: Secondary | ICD-10-CM

## 2015-06-12 DIAGNOSIS — J209 Acute bronchitis, unspecified: Secondary | ICD-10-CM | POA: Diagnosis not present

## 2015-06-12 NOTE — Progress Notes (Signed)
   Subjective:    Patient ID: Jason Clayton, male    DOB: 02/24/1946, 70 y.o.   MRN: [id number]  PT presents to the office today to recheck acute bronchitis. PT states his cough is better, but continues to have  "thick sticky mucus" coughing and "sticky to my throat". Pt states he continues to wheeze.  PT starts on Levaquin and given prednisone.    PT is also complaining of right leg swelling and discoloration. PT states he can "push on the bone" and it moves. PT states he thinks he "broke it" yesterday when he was putting his shoes on yesterday. PT states he goes to the [facility] for his care.  Cough The current episode started 1 to 4 weeks ago. The problem has been rapidly improving. The problem occurs every few minutes. The cough is productive of sputum and productive of purulent sputum. Associated symptoms include wheezing. Pertinent negatives include no chills, ear congestion, ear pain, fever, myalgias or sore throat. The symptoms are aggravated by lying down. He has tried oral steroids and OTC cough suppressant for the symptoms. The treatment provided mild relief. There is no history of asthma or COPD.      Review of Systems  Constitutional: Negative.  Negative for fever and chills.  HENT: Negative.  Negative for ear pain and sore throat.   Respiratory: Positive for cough and wheezing.   Cardiovascular: Negative.   Gastrointestinal: Negative.   Endocrine: Negative.   Musculoskeletal: Negative for myalgias.  All other systems reviewed and are negative.      Objective:   Physical Exam  Constitutional: He is oriented to person, place, and time. He appears well-developed and well-nourished. No distress.  HENT:  Head: Normocephalic.  Eyes: Pupils are equal, round, and reactive to light. Right eye exhibits no discharge. Left eye exhibits no discharge.  Neck: Normal range of motion. Neck supple. No thyromegaly present.  Cardiovascular: Normal rate, regular rhythm, normal heart sounds and  intact distal pulses.   No murmur heard. Pulmonary/Chest: Effort normal and breath sounds normal. No respiratory distress. He has no wheezes.  Abdominal: Soft. Bowel sounds are normal. He exhibits no distension. There is no tenderness.  Genitourinary:  Foley present   Musculoskeletal: He exhibits edema.  Left AKA, right leg 3+ edema, with ecchymosis and warmth present   Neurological: He is alert and oriented to person, place, and time. He has normal reflexes. No cranial nerve deficit.  Skin: Skin is warm and dry. No rash noted. There is erythema.  Ecchymosis present of right leg   Psychiatric: He has a normal mood and affect. His behavior is normal. Judgment and thought content normal.  Vitals reviewed.   BP 128/66 mmHg  Pulse 60  Temp(Src) 98 F (36.7 C) (Oral)  Wt   Right leg- Tibia fracture noted Preliminary reading by Jannifer Rodneyhristy Fe Okubo, FNP [facility]      Assessment & Plan:  1. Right leg injury, initial encounter - DG Tibia/Fibula Right; Future  2. Acute bronchitis, unspecified organism -Greatly improved -Continue Mucinex   3. Right tibial fracture, closed, initial encounter -Pt wants to go the [facility] for fracture -Pt told this is very important to do asap- Risks of DVT discussed -X-ray on disc given to patient to take to [facility] -RTO prn   Jannifer Rodneyhristy Jenelle Drennon, FNP

## 2015-06-12 NOTE — Patient Instructions (Addendum)
Nondisplaced Tibial Plateau Fracture °A tibial plateau fracture is a break in the bone that forms the bottom of your knee joint (tibia or shin bone). The lower end of your thigh bone (femur) forms the upper surface of your knee joint. The top of the tibia has a flat, smooth surface (tibial plateau). This part of the shin bone is made of softer bone than the shaft of the shin bone. If a strong force shoves your femur down into your tibial plateau, the tibial plateau can collapse or break away at the edges. This is also called an intra-articular fracture. °A nondisplaced fracture means that the broken piece or pieces of your tibial plateau have not moved out of their normal position. This type of fracture can usually be treated without surgery. You will need to wear a brace or a splint and avoid using your knee to support your body weight while the fracture is healing. °CAUSES °Common causes of this type of fracture include: °· Car accidents. °· Jumps or falls from a significant height. °· Injuries from high-energy sports or contact sports. °RISK FACTORS °You may be at higher risk for this type of fracture if: °· You play high-energy sports or contact sports. °· You have a history of bone infections. °· You are an older person with a condition that causes weak bones (osteoporosis). °SIGNS AND SYMPTOMS °Signs and symptoms of a nondisplaced tibial plateau fracture begin immediately after the injury. They may include: °· Pain that is worse when you use your knee to support your body weight. °· Swelling of your knee. °· Bruising around your knee. °DIAGNOSIS °Your health care provider may suspect a nondisplaced tibial plateau fracture based on your signs and symptoms, especially if you had a recent injury. Your health care provider will also do a physical exam. This may include imaging tests such as: °· X-ray of your knee. This is used to confirm the diagnosis. °· CT scan or MRI. This checks to make sure that no bones have  moved out of place and that there are no other injuries to your knee. °TREATMENT °Treatment for a nondisplaced tibial plateau fracture may involve wearing a brace or a splint to keep your leg in one position while it heals. You may also need to use crutches, a scooter, a walker, or a wheelchair so you can move around without using your injured leg to support your body weight. You may also be prescribed pain medicine. °While your knee is healing, you may see a physical therapist. Your physical therapist will move your knee without putting weight on it (passive exercise). This helps to keep your knee from becoming stiff. After your leg has healed, your health care provider will show you how to do range-of-motion exercises to strengthen your knee muscles and prevent stiffness. °HOME CARE INSTRUCTIONS °If You Have a Brace or a Splint: °· Wear it as directed by your health care provider. Remove it only as directed by your health care provider. °· Loosen the brace or splint if your toes become numb and tingle, or if they turn cold and blue. °Bathing °· Cover the brace or splint with a watertight plastic bag to protect it from water while you bathe or shower. Do not let the brace or splint get wet. °Managing Pain, Stiffness, and Swelling °· If directed, apply ice to the injured area: °¨ Put ice in a plastic bag. °¨ Place a towel between your skin and the bag. °¨ Leave the ice on for 20   minutes, 2-3 times a day. °· Move your toes and ankle often to avoid stiffness and to lessen swelling. °· Raise the injured area above the level of your heart while you are sitting or lying down. °Driving °· Do not drive or operate heavy machinery while taking pain medicine. °· Do not drive while wearing a brace or a splint on a leg that you use for driving. °Activity °· Return to your normal activities as directed by your health care provider. Ask your health care provider what activities are safe for you. °· Perform range-of-motion  exercises only as directed by your health care provider. °Safety °· Do not use the injured limb to support your body weight until your health care provider says that you can. Use crutches, a scooter, a walker, or a wheelchair as directed by your health care provider. °General Instructions °· Keep the brace or splint clean and dry. °· Do not use any tobacco products, including cigarettes, chewing tobacco, or electronic cigarettes. Tobacco can delay bone healing. If you need help quitting, ask your health care provider. °· Take medicines only as directed by your health care provider. °· Keep all follow-up visits as directed by your health care provider. This is important. °SEEK MEDICAL CARE IF: °· Your pain is getting worse. °· Your pain medicine is not helping. °SEEK IMMEDIATE MEDICAL CARE IF: °· You have very bad pain in your injured leg. °· You have swelling or redness in your foot that is getting worse. °· You begin to lose feeling in your foot or your toes. °· Your foot or your toes are cold or turn blue. °  °This information is not intended to replace advice given to you by your health care provider. Make sure you discuss any questions you have with your health care provider. °  °Document Released: 01/02/2005 Document Revised: 04/15/2014 Document Reviewed: 11/10/2013 °Elsevier Interactive Patient Education ©2016 Elsevier Inc. ° °

## 2015-07-03 ENCOUNTER — Telehealth: Payer: Self-pay | Admitting: Family Medicine

## 2015-07-03 NOTE — Telephone Encounter (Signed)
Patient is going to the ER he was already on his way.

## 2015-07-04 ENCOUNTER — Encounter: Payer: Self-pay | Admitting: Family

## 2015-07-04 ENCOUNTER — Ambulatory Visit (INDEPENDENT_AMBULATORY_CARE_PROVIDER_SITE_OTHER): Payer: Medicare Other | Admitting: Family

## 2015-07-04 ENCOUNTER — Ambulatory Visit (INDEPENDENT_AMBULATORY_CARE_PROVIDER_SITE_OTHER): Payer: Medicare Other

## 2015-07-04 ENCOUNTER — Ambulatory Visit: Payer: Medicare Other | Admitting: Family Medicine

## 2015-07-04 VITALS — BP 114/77 | HR 88 | Temp 98.4°F

## 2015-07-04 DIAGNOSIS — L089 Local infection of the skin and subcutaneous tissue, unspecified: Secondary | ICD-10-CM

## 2015-07-04 MED ORDER — DOXYCYCLINE HYCLATE 100 MG PO TABS: 100.0000 mg | ORAL_TABLET | Freq: Two times a day (BID) | ORAL | Status: DC

## 2015-07-04 MED ORDER — MUPIROCIN 2 % EX OINT: 1.0000 "application " | TOPICAL_OINTMENT | Freq: Two times a day (BID) | CUTANEOUS | Status: DC

## 2015-07-04 MED ORDER — CEFTRIAXONE SODIUM 1 G IJ SOLR
1.0000 g | Freq: Once | INTRAMUSCULAR | Status: AC
Start: 2015-07-04 — End: 2015-07-04
  Administered 2015-07-04: 1 g via INTRAMUSCULAR

## 2015-07-04 MED ORDER — MUPIROCIN 2 % EX OINT: 1.0000 "application " | TOPICAL_OINTMENT | Freq: Two times a day (BID) | CUTANEOUS | Status: AC

## 2015-07-04 NOTE — Progress Notes (Signed)
   Subjective:    Patient ID: Jason Clayton, male    DOB: 10/29/1945, 70 y.o.   MRN: [id number]  HPI Pt presents to the office today with infection right foot and right toe. PT was seen in the office on 06/12/15 with a tibia fracture. PT went the the [facility] and "they put a brace on it". Pt is a paraplegia and has a Left AKA. Pt states he noticed his fourth toe was swollen and red. Pt states he can not remember hitting it, but states his toe nail was hanging off yesterday so he pulled it off. Pt states it is draining a "yellow stingy" discharge. Pt states he went to the ED yesterday and sat for 4 hours without being seen so he left.    Review of Systems  Constitutional: Negative.   HENT: Negative.   Respiratory: Negative.   Cardiovascular: Negative.   Gastrointestinal: Negative.   Endocrine: Negative.   Genitourinary: Negative.   Musculoskeletal: Negative.   Neurological: Negative.   Hematological: Negative.   Psychiatric/Behavioral: Negative.   All other systems reviewed and are negative.      Objective:   Physical Exam  Constitutional: He is oriented to person, place, and time. He appears well-developed and well-nourished. No distress.  HENT:  Head: Normocephalic.  Eyes: Pupils are equal, round, and reactive to light. Right eye exhibits no discharge. Left eye exhibits no discharge.  Neck: Normal range of motion. Neck supple. No thyromegaly present.  Cardiovascular: Normal rate, regular rhythm, normal heart sounds and intact distal pulses.   No murmur heard. Pulmonary/Chest: Effort normal and breath sounds normal. No respiratory distress. He has no wheezes.  Abdominal: Soft. Bowel sounds are normal. He exhibits no distension. There is no tenderness.  Musculoskeletal: Normal range of motion. He exhibits edema (2+ in right foot, fourth toe erythemas, with slough drainage). He exhibits no tenderness.  Left AKA  Neurological: He is alert and oriented to person, place, and time.    paraplegia  Skin: Skin is warm and dry. No rash noted. No erythema.  Psychiatric: He has a normal mood and affect. His behavior is normal. Judgment and thought content normal.  Vitals reviewed.  Foot x-ray- No osteomyelitis noted Preliminary reading by Jannifer Rodneyhristy Hawks, FNP [facility]    BP 114/77 mmHg  Pulse 88  Temp(Src) 98.4 F (36.9 C) (Oral)     Assessment & Plan:  1. Toe infection -Dressing change BID -Stat referral to wound care -Keep clean and dry -RTO in 2 days if does not have appt with wound care yet - DG Foot Complete Right; Future - cefTRIAXone (ROCEPHIN) injection 1 g; Inject 1 g into the muscle once. - mupirocin ointment (BACTROBAN) 2 %; Place 1 application into the nose 2 (two) times daily.  Dispense: 22 g; Refill: 0 - doxycycline (VIBRA-TABS) 100 MG tablet; Take 1 tablet (100 mg total) by mouth 2 (two) times daily.  Dispense: 20 tablet; Refill: 0 - AMB referral to wound care center  Jannifer Rodneyhristy Hawks, FNP

## 2015-07-04 NOTE — Patient Instructions (Signed)
Wound Care °Taking care of your wound properly can help to prevent pain and infection. It can also help your wound to heal more quickly.  °HOW TO CARE FOR YOUR WOUND  °· Take or apply over-the-counter and prescription medicines only as told by your health care provider. °· If you were prescribed antibiotic medicine, take or apply it as told by your health care provider. Do not stop using the antibiotic even if your condition improves. °· Clean the wound each day or as told by your health care provider. °¨ Wash the wound with mild soap and water. °¨ Rinse the wound with water to remove all soap. °¨ Pat the wound dry with a clean towel. Do not rub it. °· There are many different ways to close and cover a wound. For example, a wound can be covered with stitches (sutures), skin glue, or adhesive strips. Follow instructions from your health care provider about: °¨ How to take care of your wound. °¨ When and how you should change your bandage (dressing). °¨ When you should remove your dressing. °¨ Removing whatever was used to close your wound. °· Check your wound every day for signs of infection. Watch for: °¨ Redness, swelling, or pain. °¨ Fluid, blood, or pus. °· Keep the dressing dry until your health care provider says it can be removed. Do not take baths, swim, use a hot tub, or do anything that would put your wound underwater until your health care provider approves. °· Raise (elevate) the injured area above the level of your heart while you are sitting or lying down. °· Do not scratch or pick at the wound. °· Keep all follow-up visits as told by your health care provider. This is important. °SEEK MEDICAL CARE IF: °· You received a tetanus shot and you have swelling, severe pain, redness, or bleeding at the injection site. °· You have a fever. °· Your pain is not controlled with medicine. °· You have increased redness, swelling, or pain at the site of your wound. °· You have fluid, blood, or pus coming from your  wound. °· You notice a bad smell coming from your wound or your dressing. °SEEK IMMEDIATE MEDICAL CARE IF: °· You have a red streak going away from your wound. °  °This information is not intended to replace advice given to you by your health care provider. Make sure you discuss any questions you have with your health care provider. °  °Document Released: 01/02/2008 Document Revised: 08/09/2014 Document Reviewed: 03/21/2014 °Elsevier Interactive Patient Education ©2016 Else[employer] Inc. ° °

## 2015-07-04 NOTE — Addendum Note (Signed)
Addended by: Jannifer RodneyHAWKS, Hartley Wyke A on: 07/04/2015 01:03 PM   Modules accepted: Orders

## 2015-07-06 ENCOUNTER — Ambulatory Visit: Payer: Medicare Other | Admitting: Family

## 2015-12-15 DIAGNOSIS — Z79891 Long term (current) use of opiate analgesic: Secondary | ICD-10-CM | POA: Diagnosis not present

## 2015-12-15 DIAGNOSIS — G8221 Paraplegia, complete: Secondary | ICD-10-CM | POA: Diagnosis not present

## 2015-12-15 DIAGNOSIS — I10 Essential (primary) hypertension: Secondary | ICD-10-CM | POA: Diagnosis not present

## 2015-12-15 DIAGNOSIS — F17210 Nicotine dependence, cigarettes, uncomplicated: Secondary | ICD-10-CM | POA: Diagnosis not present

## 2015-12-15 DIAGNOSIS — Z466 Encounter for fitting and adjustment of urinary device: Secondary | ICD-10-CM | POA: Diagnosis not present

## 2015-12-15 DIAGNOSIS — Z89612 Acquired absence of left leg above knee: Secondary | ICD-10-CM | POA: Diagnosis not present

## 2015-12-15 DIAGNOSIS — N319 Neuromuscular dysfunction of bladder, unspecified: Secondary | ICD-10-CM | POA: Diagnosis not present

## 2015-12-15 DIAGNOSIS — L89893 Pressure ulcer of other site, stage 3: Secondary | ICD-10-CM | POA: Diagnosis not present

## 2015-12-15 DIAGNOSIS — B1920 Unspecified viral hepatitis C without hepatic coma: Secondary | ICD-10-CM | POA: Diagnosis not present

## 2015-12-15 DIAGNOSIS — Z9181 History of falling: Secondary | ICD-10-CM | POA: Diagnosis not present

## 2015-12-15 DIAGNOSIS — S24104S Unspecified injury at T11-T12 level of thoracic spinal cord, sequela: Secondary | ICD-10-CM | POA: Diagnosis not present

## 2015-12-15 DIAGNOSIS — F4310 Post-traumatic stress disorder, unspecified: Secondary | ICD-10-CM | POA: Diagnosis not present

## 2015-12-20 DIAGNOSIS — I10 Essential (primary) hypertension: Secondary | ICD-10-CM | POA: Diagnosis not present

## 2015-12-20 DIAGNOSIS — G8221 Paraplegia, complete: Secondary | ICD-10-CM | POA: Diagnosis not present

## 2015-12-20 DIAGNOSIS — Z466 Encounter for fitting and adjustment of urinary device: Secondary | ICD-10-CM | POA: Diagnosis not present

## 2015-12-20 DIAGNOSIS — L89893 Pressure ulcer of other site, stage 3: Secondary | ICD-10-CM | POA: Diagnosis not present

## 2015-12-20 DIAGNOSIS — S24104S Unspecified injury at T11-T12 level of thoracic spinal cord, sequela: Secondary | ICD-10-CM | POA: Diagnosis not present

## 2015-12-20 DIAGNOSIS — N319 Neuromuscular dysfunction of bladder, unspecified: Secondary | ICD-10-CM | POA: Diagnosis not present

## 2015-12-22 DIAGNOSIS — L89893 Pressure ulcer of other site, stage 3: Secondary | ICD-10-CM | POA: Diagnosis not present

## 2015-12-22 DIAGNOSIS — G8221 Paraplegia, complete: Secondary | ICD-10-CM | POA: Diagnosis not present

## 2015-12-22 DIAGNOSIS — S24104S Unspecified injury at T11-T12 level of thoracic spinal cord, sequela: Secondary | ICD-10-CM | POA: Diagnosis not present

## 2015-12-22 DIAGNOSIS — I10 Essential (primary) hypertension: Secondary | ICD-10-CM | POA: Diagnosis not present

## 2015-12-22 DIAGNOSIS — N319 Neuromuscular dysfunction of bladder, unspecified: Secondary | ICD-10-CM | POA: Diagnosis not present

## 2015-12-22 DIAGNOSIS — Z466 Encounter for fitting and adjustment of urinary device: Secondary | ICD-10-CM | POA: Diagnosis not present

## 2015-12-25 DIAGNOSIS — S24104S Unspecified injury at T11-T12 level of thoracic spinal cord, sequela: Secondary | ICD-10-CM | POA: Diagnosis not present

## 2015-12-25 DIAGNOSIS — L89893 Pressure ulcer of other site, stage 3: Secondary | ICD-10-CM | POA: Diagnosis not present

## 2015-12-25 DIAGNOSIS — Z466 Encounter for fitting and adjustment of urinary device: Secondary | ICD-10-CM | POA: Diagnosis not present

## 2015-12-25 DIAGNOSIS — N319 Neuromuscular dysfunction of bladder, unspecified: Secondary | ICD-10-CM | POA: Diagnosis not present

## 2015-12-25 DIAGNOSIS — I10 Essential (primary) hypertension: Secondary | ICD-10-CM | POA: Diagnosis not present

## 2015-12-25 DIAGNOSIS — G8221 Paraplegia, complete: Secondary | ICD-10-CM | POA: Diagnosis not present

## 2015-12-27 DIAGNOSIS — S24104S Unspecified injury at T11-T12 level of thoracic spinal cord, sequela: Secondary | ICD-10-CM | POA: Diagnosis not present

## 2015-12-27 DIAGNOSIS — N319 Neuromuscular dysfunction of bladder, unspecified: Secondary | ICD-10-CM | POA: Diagnosis not present

## 2015-12-27 DIAGNOSIS — L89893 Pressure ulcer of other site, stage 3: Secondary | ICD-10-CM | POA: Diagnosis not present

## 2015-12-27 DIAGNOSIS — I10 Essential (primary) hypertension: Secondary | ICD-10-CM | POA: Diagnosis not present

## 2015-12-27 DIAGNOSIS — G8221 Paraplegia, complete: Secondary | ICD-10-CM | POA: Diagnosis not present

## 2015-12-27 DIAGNOSIS — Z466 Encounter for fitting and adjustment of urinary device: Secondary | ICD-10-CM | POA: Diagnosis not present

## 2015-12-28 DIAGNOSIS — I10 Essential (primary) hypertension: Secondary | ICD-10-CM | POA: Diagnosis not present

## 2015-12-28 DIAGNOSIS — L89893 Pressure ulcer of other site, stage 3: Secondary | ICD-10-CM | POA: Diagnosis not present

## 2015-12-28 DIAGNOSIS — G8221 Paraplegia, complete: Secondary | ICD-10-CM | POA: Diagnosis not present

## 2015-12-28 DIAGNOSIS — S24104S Unspecified injury at T11-T12 level of thoracic spinal cord, sequela: Secondary | ICD-10-CM | POA: Diagnosis not present

## 2015-12-28 DIAGNOSIS — Z466 Encounter for fitting and adjustment of urinary device: Secondary | ICD-10-CM | POA: Diagnosis not present

## 2015-12-28 DIAGNOSIS — N319 Neuromuscular dysfunction of bladder, unspecified: Secondary | ICD-10-CM | POA: Diagnosis not present

## 2015-12-29 DIAGNOSIS — Z466 Encounter for fitting and adjustment of urinary device: Secondary | ICD-10-CM | POA: Diagnosis not present

## 2015-12-29 DIAGNOSIS — N319 Neuromuscular dysfunction of bladder, unspecified: Secondary | ICD-10-CM | POA: Diagnosis not present

## 2015-12-29 DIAGNOSIS — I10 Essential (primary) hypertension: Secondary | ICD-10-CM | POA: Diagnosis not present

## 2015-12-29 DIAGNOSIS — G8221 Paraplegia, complete: Secondary | ICD-10-CM | POA: Diagnosis not present

## 2015-12-29 DIAGNOSIS — L89893 Pressure ulcer of other site, stage 3: Secondary | ICD-10-CM | POA: Diagnosis not present

## 2015-12-29 DIAGNOSIS — S24104S Unspecified injury at T11-T12 level of thoracic spinal cord, sequela: Secondary | ICD-10-CM | POA: Diagnosis not present

## 2016-01-01 DIAGNOSIS — S24104S Unspecified injury at T11-T12 level of thoracic spinal cord, sequela: Secondary | ICD-10-CM | POA: Diagnosis not present

## 2016-01-01 DIAGNOSIS — G8221 Paraplegia, complete: Secondary | ICD-10-CM | POA: Diagnosis not present

## 2016-01-01 DIAGNOSIS — Z466 Encounter for fitting and adjustment of urinary device: Secondary | ICD-10-CM | POA: Diagnosis not present

## 2016-01-01 DIAGNOSIS — I10 Essential (primary) hypertension: Secondary | ICD-10-CM | POA: Diagnosis not present

## 2016-01-01 DIAGNOSIS — L89893 Pressure ulcer of other site, stage 3: Secondary | ICD-10-CM | POA: Diagnosis not present

## 2016-01-01 DIAGNOSIS — N319 Neuromuscular dysfunction of bladder, unspecified: Secondary | ICD-10-CM | POA: Diagnosis not present

## 2016-01-03 DIAGNOSIS — Z466 Encounter for fitting and adjustment of urinary device: Secondary | ICD-10-CM | POA: Diagnosis not present

## 2016-01-03 DIAGNOSIS — I10 Essential (primary) hypertension: Secondary | ICD-10-CM | POA: Diagnosis not present

## 2016-01-03 DIAGNOSIS — S24104S Unspecified injury at T11-T12 level of thoracic spinal cord, sequela: Secondary | ICD-10-CM | POA: Diagnosis not present

## 2016-01-03 DIAGNOSIS — G8221 Paraplegia, complete: Secondary | ICD-10-CM | POA: Diagnosis not present

## 2016-01-03 DIAGNOSIS — N319 Neuromuscular dysfunction of bladder, unspecified: Secondary | ICD-10-CM | POA: Diagnosis not present

## 2016-01-03 DIAGNOSIS — L89893 Pressure ulcer of other site, stage 3: Secondary | ICD-10-CM | POA: Diagnosis not present

## 2016-01-08 DIAGNOSIS — I10 Essential (primary) hypertension: Secondary | ICD-10-CM | POA: Diagnosis not present

## 2016-01-08 DIAGNOSIS — Z466 Encounter for fitting and adjustment of urinary device: Secondary | ICD-10-CM | POA: Diagnosis not present

## 2016-01-08 DIAGNOSIS — G8221 Paraplegia, complete: Secondary | ICD-10-CM | POA: Diagnosis not present

## 2016-01-08 DIAGNOSIS — S24104S Unspecified injury at T11-T12 level of thoracic spinal cord, sequela: Secondary | ICD-10-CM | POA: Diagnosis not present

## 2016-01-08 DIAGNOSIS — L89893 Pressure ulcer of other site, stage 3: Secondary | ICD-10-CM | POA: Diagnosis not present

## 2016-01-08 DIAGNOSIS — N319 Neuromuscular dysfunction of bladder, unspecified: Secondary | ICD-10-CM | POA: Diagnosis not present

## 2016-01-10 DIAGNOSIS — I10 Essential (primary) hypertension: Secondary | ICD-10-CM | POA: Diagnosis not present

## 2016-01-10 DIAGNOSIS — G8221 Paraplegia, complete: Secondary | ICD-10-CM | POA: Diagnosis not present

## 2016-01-10 DIAGNOSIS — L89893 Pressure ulcer of other site, stage 3: Secondary | ICD-10-CM | POA: Diagnosis not present

## 2016-01-10 DIAGNOSIS — N319 Neuromuscular dysfunction of bladder, unspecified: Secondary | ICD-10-CM | POA: Diagnosis not present

## 2016-01-10 DIAGNOSIS — S24104S Unspecified injury at T11-T12 level of thoracic spinal cord, sequela: Secondary | ICD-10-CM | POA: Diagnosis not present

## 2016-01-10 DIAGNOSIS — Z466 Encounter for fitting and adjustment of urinary device: Secondary | ICD-10-CM | POA: Diagnosis not present

## 2016-01-11 DIAGNOSIS — S24104S Unspecified injury at T11-T12 level of thoracic spinal cord, sequela: Secondary | ICD-10-CM | POA: Diagnosis not present

## 2016-01-11 DIAGNOSIS — L89893 Pressure ulcer of other site, stage 3: Secondary | ICD-10-CM | POA: Diagnosis not present

## 2016-01-11 DIAGNOSIS — I10 Essential (primary) hypertension: Secondary | ICD-10-CM | POA: Diagnosis not present

## 2016-01-11 DIAGNOSIS — N319 Neuromuscular dysfunction of bladder, unspecified: Secondary | ICD-10-CM | POA: Diagnosis not present

## 2016-01-11 DIAGNOSIS — Z466 Encounter for fitting and adjustment of urinary device: Secondary | ICD-10-CM | POA: Diagnosis not present

## 2016-01-11 DIAGNOSIS — G8221 Paraplegia, complete: Secondary | ICD-10-CM | POA: Diagnosis not present

## 2016-01-15 DIAGNOSIS — N319 Neuromuscular dysfunction of bladder, unspecified: Secondary | ICD-10-CM | POA: Diagnosis not present

## 2016-01-15 DIAGNOSIS — Z466 Encounter for fitting and adjustment of urinary device: Secondary | ICD-10-CM | POA: Diagnosis not present

## 2016-01-15 DIAGNOSIS — I10 Essential (primary) hypertension: Secondary | ICD-10-CM | POA: Diagnosis not present

## 2016-01-15 DIAGNOSIS — L89893 Pressure ulcer of other site, stage 3: Secondary | ICD-10-CM | POA: Diagnosis not present

## 2016-01-15 DIAGNOSIS — S24104S Unspecified injury at T11-T12 level of thoracic spinal cord, sequela: Secondary | ICD-10-CM | POA: Diagnosis not present

## 2016-01-15 DIAGNOSIS — G8221 Paraplegia, complete: Secondary | ICD-10-CM | POA: Diagnosis not present

## 2016-01-16 DIAGNOSIS — N319 Neuromuscular dysfunction of bladder, unspecified: Secondary | ICD-10-CM | POA: Diagnosis not present

## 2016-01-16 DIAGNOSIS — S24104S Unspecified injury at T11-T12 level of thoracic spinal cord, sequela: Secondary | ICD-10-CM | POA: Diagnosis not present

## 2016-01-16 DIAGNOSIS — I10 Essential (primary) hypertension: Secondary | ICD-10-CM | POA: Diagnosis not present

## 2016-01-16 DIAGNOSIS — L89893 Pressure ulcer of other site, stage 3: Secondary | ICD-10-CM | POA: Diagnosis not present

## 2016-01-16 DIAGNOSIS — Z466 Encounter for fitting and adjustment of urinary device: Secondary | ICD-10-CM | POA: Diagnosis not present

## 2016-01-16 DIAGNOSIS — G8221 Paraplegia, complete: Secondary | ICD-10-CM | POA: Diagnosis not present

## 2016-01-17 DIAGNOSIS — L89893 Pressure ulcer of other site, stage 3: Secondary | ICD-10-CM | POA: Diagnosis not present

## 2016-01-17 DIAGNOSIS — Z466 Encounter for fitting and adjustment of urinary device: Secondary | ICD-10-CM | POA: Diagnosis not present

## 2016-01-17 DIAGNOSIS — G8221 Paraplegia, complete: Secondary | ICD-10-CM | POA: Diagnosis not present

## 2016-01-17 DIAGNOSIS — N319 Neuromuscular dysfunction of bladder, unspecified: Secondary | ICD-10-CM | POA: Diagnosis not present

## 2016-01-17 DIAGNOSIS — S24104S Unspecified injury at T11-T12 level of thoracic spinal cord, sequela: Secondary | ICD-10-CM | POA: Diagnosis not present

## 2016-01-17 DIAGNOSIS — I10 Essential (primary) hypertension: Secondary | ICD-10-CM | POA: Diagnosis not present

## 2016-01-18 DIAGNOSIS — H2513 Age-related nuclear cataract, bilateral: Secondary | ICD-10-CM | POA: Diagnosis not present

## 2016-01-18 DIAGNOSIS — H524 Presbyopia: Secondary | ICD-10-CM | POA: Diagnosis not present

## 2016-01-18 DIAGNOSIS — H52221 Regular astigmatism, right eye: Secondary | ICD-10-CM | POA: Diagnosis not present

## 2016-01-18 DIAGNOSIS — H5203 Hypermetropia, bilateral: Secondary | ICD-10-CM | POA: Diagnosis not present

## 2016-01-26 DIAGNOSIS — Z466 Encounter for fitting and adjustment of urinary device: Secondary | ICD-10-CM | POA: Diagnosis not present

## 2016-01-26 DIAGNOSIS — I10 Essential (primary) hypertension: Secondary | ICD-10-CM | POA: Diagnosis not present

## 2016-01-26 DIAGNOSIS — S24104S Unspecified injury at T11-T12 level of thoracic spinal cord, sequela: Secondary | ICD-10-CM | POA: Diagnosis not present

## 2016-01-26 DIAGNOSIS — G8221 Paraplegia, complete: Secondary | ICD-10-CM | POA: Diagnosis not present

## 2016-01-26 DIAGNOSIS — N319 Neuromuscular dysfunction of bladder, unspecified: Secondary | ICD-10-CM | POA: Diagnosis not present

## 2016-01-26 DIAGNOSIS — L89893 Pressure ulcer of other site, stage 3: Secondary | ICD-10-CM | POA: Diagnosis not present

## 2016-02-02 DIAGNOSIS — I10 Essential (primary) hypertension: Secondary | ICD-10-CM | POA: Diagnosis not present

## 2016-02-02 DIAGNOSIS — G8221 Paraplegia, complete: Secondary | ICD-10-CM | POA: Diagnosis not present

## 2016-02-02 DIAGNOSIS — L89893 Pressure ulcer of other site, stage 3: Secondary | ICD-10-CM | POA: Diagnosis not present

## 2016-02-02 DIAGNOSIS — N319 Neuromuscular dysfunction of bladder, unspecified: Secondary | ICD-10-CM | POA: Diagnosis not present

## 2016-02-02 DIAGNOSIS — Z466 Encounter for fitting and adjustment of urinary device: Secondary | ICD-10-CM | POA: Diagnosis not present

## 2016-02-02 DIAGNOSIS — S24104S Unspecified injury at T11-T12 level of thoracic spinal cord, sequela: Secondary | ICD-10-CM | POA: Diagnosis not present

## 2016-02-08 DIAGNOSIS — L89893 Pressure ulcer of other site, stage 3: Secondary | ICD-10-CM | POA: Diagnosis not present

## 2016-02-08 DIAGNOSIS — N319 Neuromuscular dysfunction of bladder, unspecified: Secondary | ICD-10-CM | POA: Diagnosis not present

## 2016-02-08 DIAGNOSIS — S24104S Unspecified injury at T11-T12 level of thoracic spinal cord, sequela: Secondary | ICD-10-CM | POA: Diagnosis not present

## 2016-02-08 DIAGNOSIS — I10 Essential (primary) hypertension: Secondary | ICD-10-CM | POA: Diagnosis not present

## 2016-02-08 DIAGNOSIS — Z466 Encounter for fitting and adjustment of urinary device: Secondary | ICD-10-CM | POA: Diagnosis not present

## 2016-02-08 DIAGNOSIS — G8221 Paraplegia, complete: Secondary | ICD-10-CM | POA: Diagnosis not present

## 2016-02-12 DIAGNOSIS — Z466 Encounter for fitting and adjustment of urinary device: Secondary | ICD-10-CM | POA: Diagnosis not present

## 2016-02-12 DIAGNOSIS — N319 Neuromuscular dysfunction of bladder, unspecified: Secondary | ICD-10-CM | POA: Diagnosis not present

## 2016-02-12 DIAGNOSIS — G8221 Paraplegia, complete: Secondary | ICD-10-CM | POA: Diagnosis not present

## 2016-02-12 DIAGNOSIS — L89893 Pressure ulcer of other site, stage 3: Secondary | ICD-10-CM | POA: Diagnosis not present

## 2016-02-12 DIAGNOSIS — S24104S Unspecified injury at T11-T12 level of thoracic spinal cord, sequela: Secondary | ICD-10-CM | POA: Diagnosis not present

## 2016-02-12 DIAGNOSIS — I10 Essential (primary) hypertension: Secondary | ICD-10-CM | POA: Diagnosis not present

## 2016-02-13 DIAGNOSIS — S24104S Unspecified injury at T11-T12 level of thoracic spinal cord, sequela: Secondary | ICD-10-CM | POA: Diagnosis not present

## 2016-02-13 DIAGNOSIS — N319 Neuromuscular dysfunction of bladder, unspecified: Secondary | ICD-10-CM | POA: Diagnosis not present

## 2016-02-13 DIAGNOSIS — G8221 Paraplegia, complete: Secondary | ICD-10-CM | POA: Diagnosis not present

## 2016-02-13 DIAGNOSIS — I10 Essential (primary) hypertension: Secondary | ICD-10-CM | POA: Diagnosis not present

## 2016-02-13 DIAGNOSIS — L89893 Pressure ulcer of other site, stage 3: Secondary | ICD-10-CM | POA: Diagnosis not present

## 2016-02-13 DIAGNOSIS — F4310 Post-traumatic stress disorder, unspecified: Secondary | ICD-10-CM | POA: Diagnosis not present

## 2016-02-22 DIAGNOSIS — L89893 Pressure ulcer of other site, stage 3: Secondary | ICD-10-CM | POA: Diagnosis not present

## 2016-02-22 DIAGNOSIS — G8221 Paraplegia, complete: Secondary | ICD-10-CM | POA: Diagnosis not present

## 2016-02-22 DIAGNOSIS — F4310 Post-traumatic stress disorder, unspecified: Secondary | ICD-10-CM | POA: Diagnosis not present

## 2016-02-22 DIAGNOSIS — I10 Essential (primary) hypertension: Secondary | ICD-10-CM | POA: Diagnosis not present

## 2016-02-22 DIAGNOSIS — S24104S Unspecified injury at T11-T12 level of thoracic spinal cord, sequela: Secondary | ICD-10-CM | POA: Diagnosis not present

## 2016-02-22 DIAGNOSIS — N319 Neuromuscular dysfunction of bladder, unspecified: Secondary | ICD-10-CM | POA: Diagnosis not present

## 2016-03-01 DIAGNOSIS — F4310 Post-traumatic stress disorder, unspecified: Secondary | ICD-10-CM | POA: Diagnosis not present

## 2016-03-01 DIAGNOSIS — I10 Essential (primary) hypertension: Secondary | ICD-10-CM | POA: Diagnosis not present

## 2016-03-01 DIAGNOSIS — G8221 Paraplegia, complete: Secondary | ICD-10-CM | POA: Diagnosis not present

## 2016-03-01 DIAGNOSIS — S24104S Unspecified injury at T11-T12 level of thoracic spinal cord, sequela: Secondary | ICD-10-CM | POA: Diagnosis not present

## 2016-03-01 DIAGNOSIS — L89893 Pressure ulcer of other site, stage 3: Secondary | ICD-10-CM | POA: Diagnosis not present

## 2016-03-01 DIAGNOSIS — N319 Neuromuscular dysfunction of bladder, unspecified: Secondary | ICD-10-CM | POA: Diagnosis not present

## 2016-03-07 ENCOUNTER — Ambulatory Visit (INDEPENDENT_AMBULATORY_CARE_PROVIDER_SITE_OTHER): Payer: Medicare Other | Admitting: Family Medicine

## 2016-03-07 DIAGNOSIS — F4310 Post-traumatic stress disorder, unspecified: Secondary | ICD-10-CM

## 2016-03-07 DIAGNOSIS — G8221 Paraplegia, complete: Secondary | ICD-10-CM

## 2016-03-07 DIAGNOSIS — I10 Essential (primary) hypertension: Secondary | ICD-10-CM | POA: Diagnosis not present

## 2016-03-07 DIAGNOSIS — F17210 Nicotine dependence, cigarettes, uncomplicated: Secondary | ICD-10-CM | POA: Diagnosis not present

## 2016-03-07 DIAGNOSIS — S24104S Unspecified injury at T11-T12 level of thoracic spinal cord, sequela: Secondary | ICD-10-CM

## 2016-03-07 DIAGNOSIS — N319 Neuromuscular dysfunction of bladder, unspecified: Secondary | ICD-10-CM | POA: Diagnosis not present

## 2016-03-07 DIAGNOSIS — L89893 Pressure ulcer of other site, stage 3: Secondary | ICD-10-CM | POA: Diagnosis not present

## 2016-03-07 DIAGNOSIS — B1920 Unspecified viral hepatitis C without hepatic coma: Secondary | ICD-10-CM

## 2016-03-14 DIAGNOSIS — F4310 Post-traumatic stress disorder, unspecified: Secondary | ICD-10-CM | POA: Diagnosis not present

## 2016-03-14 DIAGNOSIS — G8221 Paraplegia, complete: Secondary | ICD-10-CM | POA: Diagnosis not present

## 2016-03-14 DIAGNOSIS — N319 Neuromuscular dysfunction of bladder, unspecified: Secondary | ICD-10-CM | POA: Diagnosis not present

## 2016-03-14 DIAGNOSIS — I10 Essential (primary) hypertension: Secondary | ICD-10-CM | POA: Diagnosis not present

## 2016-03-14 DIAGNOSIS — L89893 Pressure ulcer of other site, stage 3: Secondary | ICD-10-CM | POA: Diagnosis not present

## 2016-03-14 DIAGNOSIS — S24104S Unspecified injury at T11-T12 level of thoracic spinal cord, sequela: Secondary | ICD-10-CM | POA: Diagnosis not present

## 2016-03-21 DIAGNOSIS — I10 Essential (primary) hypertension: Secondary | ICD-10-CM | POA: Diagnosis not present

## 2016-03-21 DIAGNOSIS — S24104S Unspecified injury at T11-T12 level of thoracic spinal cord, sequela: Secondary | ICD-10-CM | POA: Diagnosis not present

## 2016-03-21 DIAGNOSIS — N319 Neuromuscular dysfunction of bladder, unspecified: Secondary | ICD-10-CM | POA: Diagnosis not present

## 2016-03-21 DIAGNOSIS — L89893 Pressure ulcer of other site, stage 3: Secondary | ICD-10-CM | POA: Diagnosis not present

## 2016-03-21 DIAGNOSIS — G8221 Paraplegia, complete: Secondary | ICD-10-CM | POA: Diagnosis not present

## 2016-03-21 DIAGNOSIS — F4310 Post-traumatic stress disorder, unspecified: Secondary | ICD-10-CM | POA: Diagnosis not present

## 2016-03-28 DIAGNOSIS — N319 Neuromuscular dysfunction of bladder, unspecified: Secondary | ICD-10-CM | POA: Diagnosis not present

## 2016-03-28 DIAGNOSIS — L89893 Pressure ulcer of other site, stage 3: Secondary | ICD-10-CM | POA: Diagnosis not present

## 2016-03-28 DIAGNOSIS — S24104S Unspecified injury at T11-T12 level of thoracic spinal cord, sequela: Secondary | ICD-10-CM | POA: Diagnosis not present

## 2016-03-28 DIAGNOSIS — I10 Essential (primary) hypertension: Secondary | ICD-10-CM | POA: Diagnosis not present

## 2016-03-28 DIAGNOSIS — F4310 Post-traumatic stress disorder, unspecified: Secondary | ICD-10-CM | POA: Diagnosis not present

## 2016-03-28 DIAGNOSIS — G8221 Paraplegia, complete: Secondary | ICD-10-CM | POA: Diagnosis not present

## 2016-04-04 DIAGNOSIS — L89893 Pressure ulcer of other site, stage 3: Secondary | ICD-10-CM | POA: Diagnosis not present

## 2016-04-04 DIAGNOSIS — F4310 Post-traumatic stress disorder, unspecified: Secondary | ICD-10-CM | POA: Diagnosis not present

## 2016-04-04 DIAGNOSIS — S24104S Unspecified injury at T11-T12 level of thoracic spinal cord, sequela: Secondary | ICD-10-CM | POA: Diagnosis not present

## 2016-04-04 DIAGNOSIS — I10 Essential (primary) hypertension: Secondary | ICD-10-CM | POA: Diagnosis not present

## 2016-04-04 DIAGNOSIS — N319 Neuromuscular dysfunction of bladder, unspecified: Secondary | ICD-10-CM | POA: Diagnosis not present

## 2016-04-04 DIAGNOSIS — G8221 Paraplegia, complete: Secondary | ICD-10-CM | POA: Diagnosis not present

## 2016-04-11 DIAGNOSIS — G8221 Paraplegia, complete: Secondary | ICD-10-CM | POA: Diagnosis not present

## 2016-04-11 DIAGNOSIS — L89893 Pressure ulcer of other site, stage 3: Secondary | ICD-10-CM | POA: Diagnosis not present

## 2016-04-11 DIAGNOSIS — I10 Essential (primary) hypertension: Secondary | ICD-10-CM | POA: Diagnosis not present

## 2016-04-11 DIAGNOSIS — F4310 Post-traumatic stress disorder, unspecified: Secondary | ICD-10-CM | POA: Diagnosis not present

## 2016-04-11 DIAGNOSIS — N319 Neuromuscular dysfunction of bladder, unspecified: Secondary | ICD-10-CM | POA: Diagnosis not present

## 2016-04-11 DIAGNOSIS — S24104S Unspecified injury at T11-T12 level of thoracic spinal cord, sequela: Secondary | ICD-10-CM | POA: Diagnosis not present

## 2016-04-13 DIAGNOSIS — B1920 Unspecified viral hepatitis C without hepatic coma: Secondary | ICD-10-CM | POA: Diagnosis not present

## 2016-04-13 DIAGNOSIS — S24104S Unspecified injury at T11-T12 level of thoracic spinal cord, sequela: Secondary | ICD-10-CM | POA: Diagnosis not present

## 2016-04-13 DIAGNOSIS — L89893 Pressure ulcer of other site, stage 3: Secondary | ICD-10-CM | POA: Diagnosis not present

## 2016-04-13 DIAGNOSIS — F4310 Post-traumatic stress disorder, unspecified: Secondary | ICD-10-CM | POA: Diagnosis not present

## 2016-04-13 DIAGNOSIS — I10 Essential (primary) hypertension: Secondary | ICD-10-CM | POA: Diagnosis not present

## 2016-04-13 DIAGNOSIS — G8221 Paraplegia, complete: Secondary | ICD-10-CM | POA: Diagnosis not present

## 2016-04-19 DIAGNOSIS — I10 Essential (primary) hypertension: Secondary | ICD-10-CM | POA: Diagnosis not present

## 2016-04-19 DIAGNOSIS — G8221 Paraplegia, complete: Secondary | ICD-10-CM | POA: Diagnosis not present

## 2016-04-19 DIAGNOSIS — F4310 Post-traumatic stress disorder, unspecified: Secondary | ICD-10-CM | POA: Diagnosis not present

## 2016-04-19 DIAGNOSIS — L89893 Pressure ulcer of other site, stage 3: Secondary | ICD-10-CM | POA: Diagnosis not present

## 2016-04-19 DIAGNOSIS — B1920 Unspecified viral hepatitis C without hepatic coma: Secondary | ICD-10-CM | POA: Diagnosis not present

## 2016-04-19 DIAGNOSIS — S24104S Unspecified injury at T11-T12 level of thoracic spinal cord, sequela: Secondary | ICD-10-CM | POA: Diagnosis not present

## 2016-05-02 DIAGNOSIS — G8221 Paraplegia, complete: Secondary | ICD-10-CM | POA: Diagnosis not present

## 2016-05-02 DIAGNOSIS — L89893 Pressure ulcer of other site, stage 3: Secondary | ICD-10-CM | POA: Diagnosis not present

## 2016-05-02 DIAGNOSIS — I10 Essential (primary) hypertension: Secondary | ICD-10-CM | POA: Diagnosis not present

## 2016-05-02 DIAGNOSIS — B1920 Unspecified viral hepatitis C without hepatic coma: Secondary | ICD-10-CM | POA: Diagnosis not present

## 2016-05-02 DIAGNOSIS — S24104S Unspecified injury at T11-T12 level of thoracic spinal cord, sequela: Secondary | ICD-10-CM | POA: Diagnosis not present

## 2016-05-02 DIAGNOSIS — F4310 Post-traumatic stress disorder, unspecified: Secondary | ICD-10-CM | POA: Diagnosis not present

## 2016-05-08 ENCOUNTER — Ambulatory Visit (INDEPENDENT_AMBULATORY_CARE_PROVIDER_SITE_OTHER): Payer: Medicare Other | Admitting: Family Medicine

## 2016-05-08 DIAGNOSIS — S24104S Unspecified injury at T11-T12 level of thoracic spinal cord, sequela: Secondary | ICD-10-CM

## 2016-05-08 DIAGNOSIS — F17210 Nicotine dependence, cigarettes, uncomplicated: Secondary | ICD-10-CM | POA: Diagnosis not present

## 2016-05-08 DIAGNOSIS — I10 Essential (primary) hypertension: Secondary | ICD-10-CM | POA: Diagnosis not present

## 2016-05-08 DIAGNOSIS — F4310 Post-traumatic stress disorder, unspecified: Secondary | ICD-10-CM | POA: Diagnosis not present

## 2016-05-08 DIAGNOSIS — G8221 Paraplegia, complete: Secondary | ICD-10-CM | POA: Diagnosis not present

## 2016-05-08 DIAGNOSIS — B1920 Unspecified viral hepatitis C without hepatic coma: Secondary | ICD-10-CM | POA: Diagnosis not present

## 2016-05-08 DIAGNOSIS — L89893 Pressure ulcer of other site, stage 3: Secondary | ICD-10-CM

## 2016-05-08 DIAGNOSIS — N319 Neuromuscular dysfunction of bladder, unspecified: Secondary | ICD-10-CM | POA: Diagnosis not present

## 2016-05-08 DIAGNOSIS — N39498 Other specified urinary incontinence: Secondary | ICD-10-CM

## 2016-05-09 DIAGNOSIS — S24104S Unspecified injury at T11-T12 level of thoracic spinal cord, sequela: Secondary | ICD-10-CM | POA: Diagnosis not present

## 2016-05-09 DIAGNOSIS — B1920 Unspecified viral hepatitis C without hepatic coma: Secondary | ICD-10-CM | POA: Diagnosis not present

## 2016-05-09 DIAGNOSIS — I10 Essential (primary) hypertension: Secondary | ICD-10-CM | POA: Diagnosis not present

## 2016-05-09 DIAGNOSIS — F4310 Post-traumatic stress disorder, unspecified: Secondary | ICD-10-CM | POA: Diagnosis not present

## 2016-05-09 DIAGNOSIS — G8221 Paraplegia, complete: Secondary | ICD-10-CM | POA: Diagnosis not present

## 2016-05-09 DIAGNOSIS — L89893 Pressure ulcer of other site, stage 3: Secondary | ICD-10-CM | POA: Diagnosis not present

## 2016-05-24 DIAGNOSIS — S24104S Unspecified injury at T11-T12 level of thoracic spinal cord, sequela: Secondary | ICD-10-CM | POA: Diagnosis not present

## 2016-05-24 DIAGNOSIS — F4310 Post-traumatic stress disorder, unspecified: Secondary | ICD-10-CM | POA: Diagnosis not present

## 2016-05-24 DIAGNOSIS — L89893 Pressure ulcer of other site, stage 3: Secondary | ICD-10-CM | POA: Diagnosis not present

## 2016-05-24 DIAGNOSIS — B1920 Unspecified viral hepatitis C without hepatic coma: Secondary | ICD-10-CM | POA: Diagnosis not present

## 2016-05-24 DIAGNOSIS — G8221 Paraplegia, complete: Secondary | ICD-10-CM | POA: Diagnosis not present

## 2016-05-24 DIAGNOSIS — I10 Essential (primary) hypertension: Secondary | ICD-10-CM | POA: Diagnosis not present

## 2016-06-07 DIAGNOSIS — S24104S Unspecified injury at T11-T12 level of thoracic spinal cord, sequela: Secondary | ICD-10-CM | POA: Diagnosis not present

## 2016-06-07 DIAGNOSIS — I10 Essential (primary) hypertension: Secondary | ICD-10-CM | POA: Diagnosis not present

## 2016-06-07 DIAGNOSIS — L89893 Pressure ulcer of other site, stage 3: Secondary | ICD-10-CM | POA: Diagnosis not present

## 2016-06-07 DIAGNOSIS — F4310 Post-traumatic stress disorder, unspecified: Secondary | ICD-10-CM | POA: Diagnosis not present

## 2016-06-07 DIAGNOSIS — G8221 Paraplegia, complete: Secondary | ICD-10-CM | POA: Diagnosis not present

## 2016-06-07 DIAGNOSIS — B1920 Unspecified viral hepatitis C without hepatic coma: Secondary | ICD-10-CM | POA: Diagnosis not present

## 2017-05-05 IMAGING — CR DG TIBIA/FIBULA 2V*R*
2 series · 2 of 2 positions shown · non-contrast
Comparison: None.

CLINICAL DATA: Right leg injury.

EXAM:
RIGHT TIBIA AND FIBULA - 2 VIEW

[view not recorded (1 of 2)]
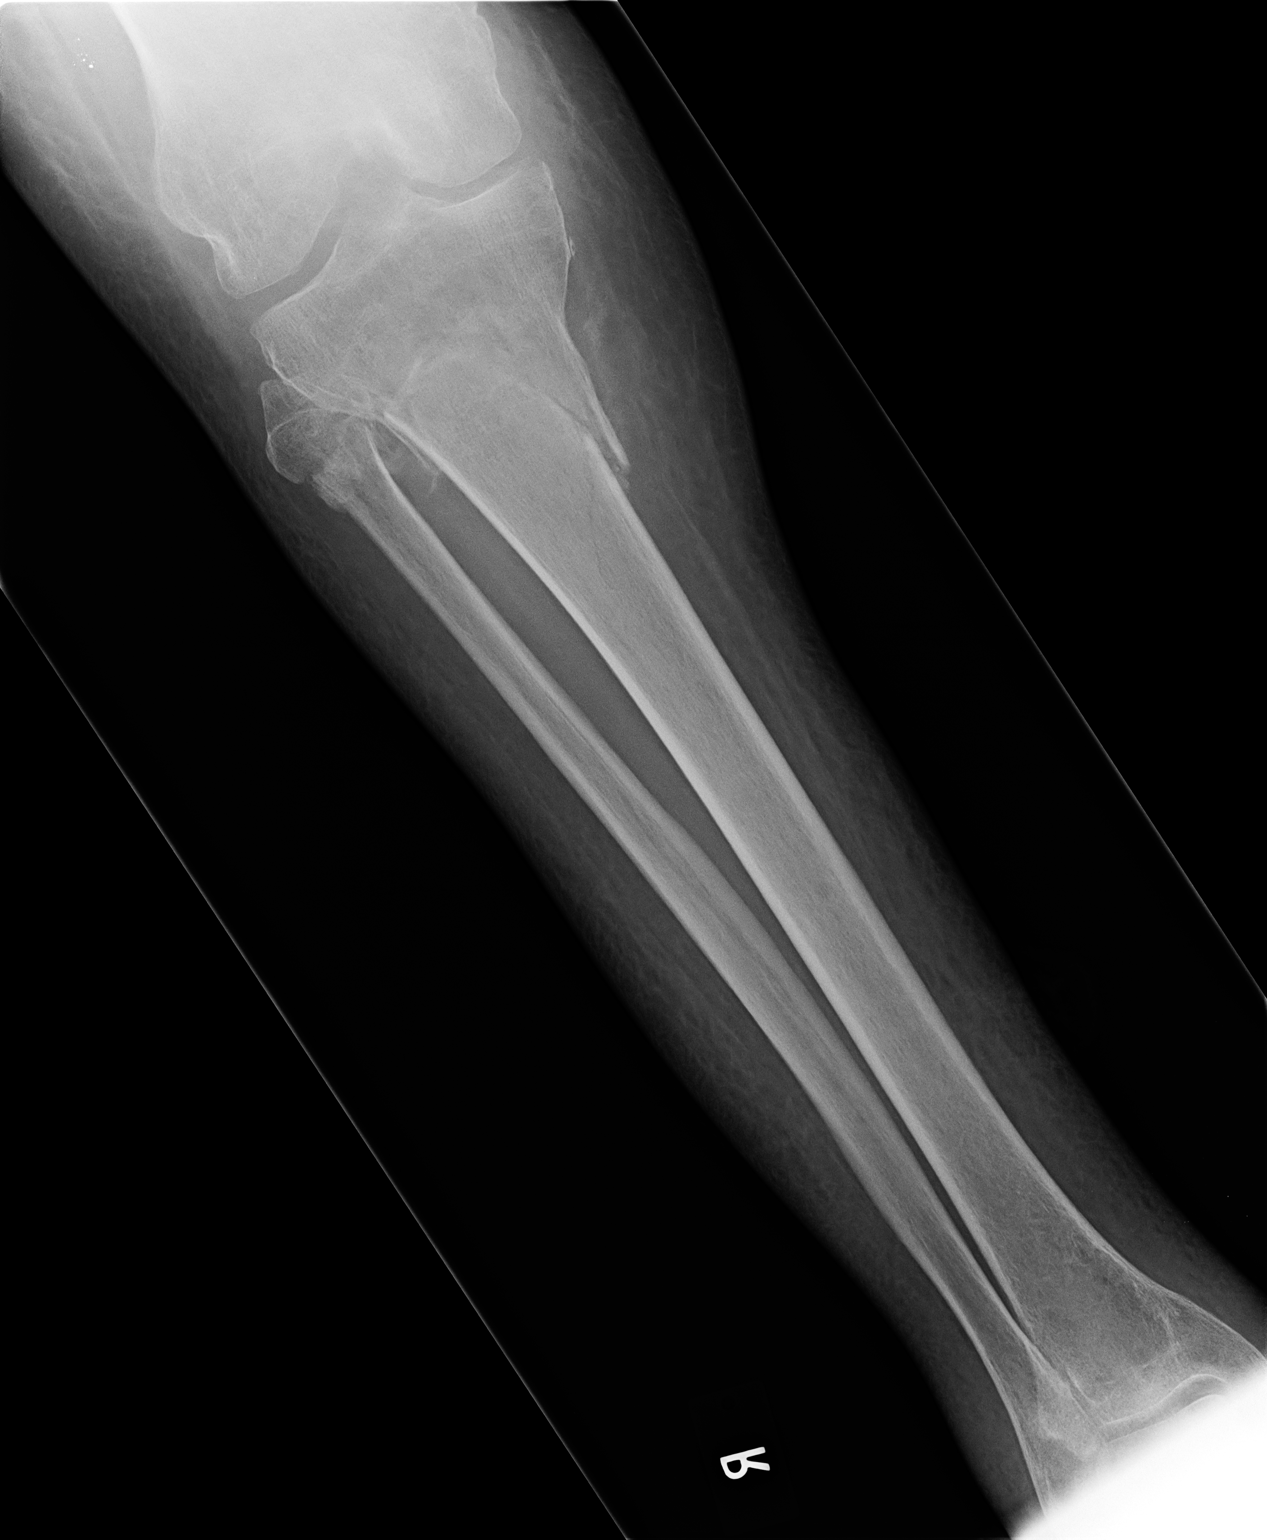

[view not recorded (2 of 2)]
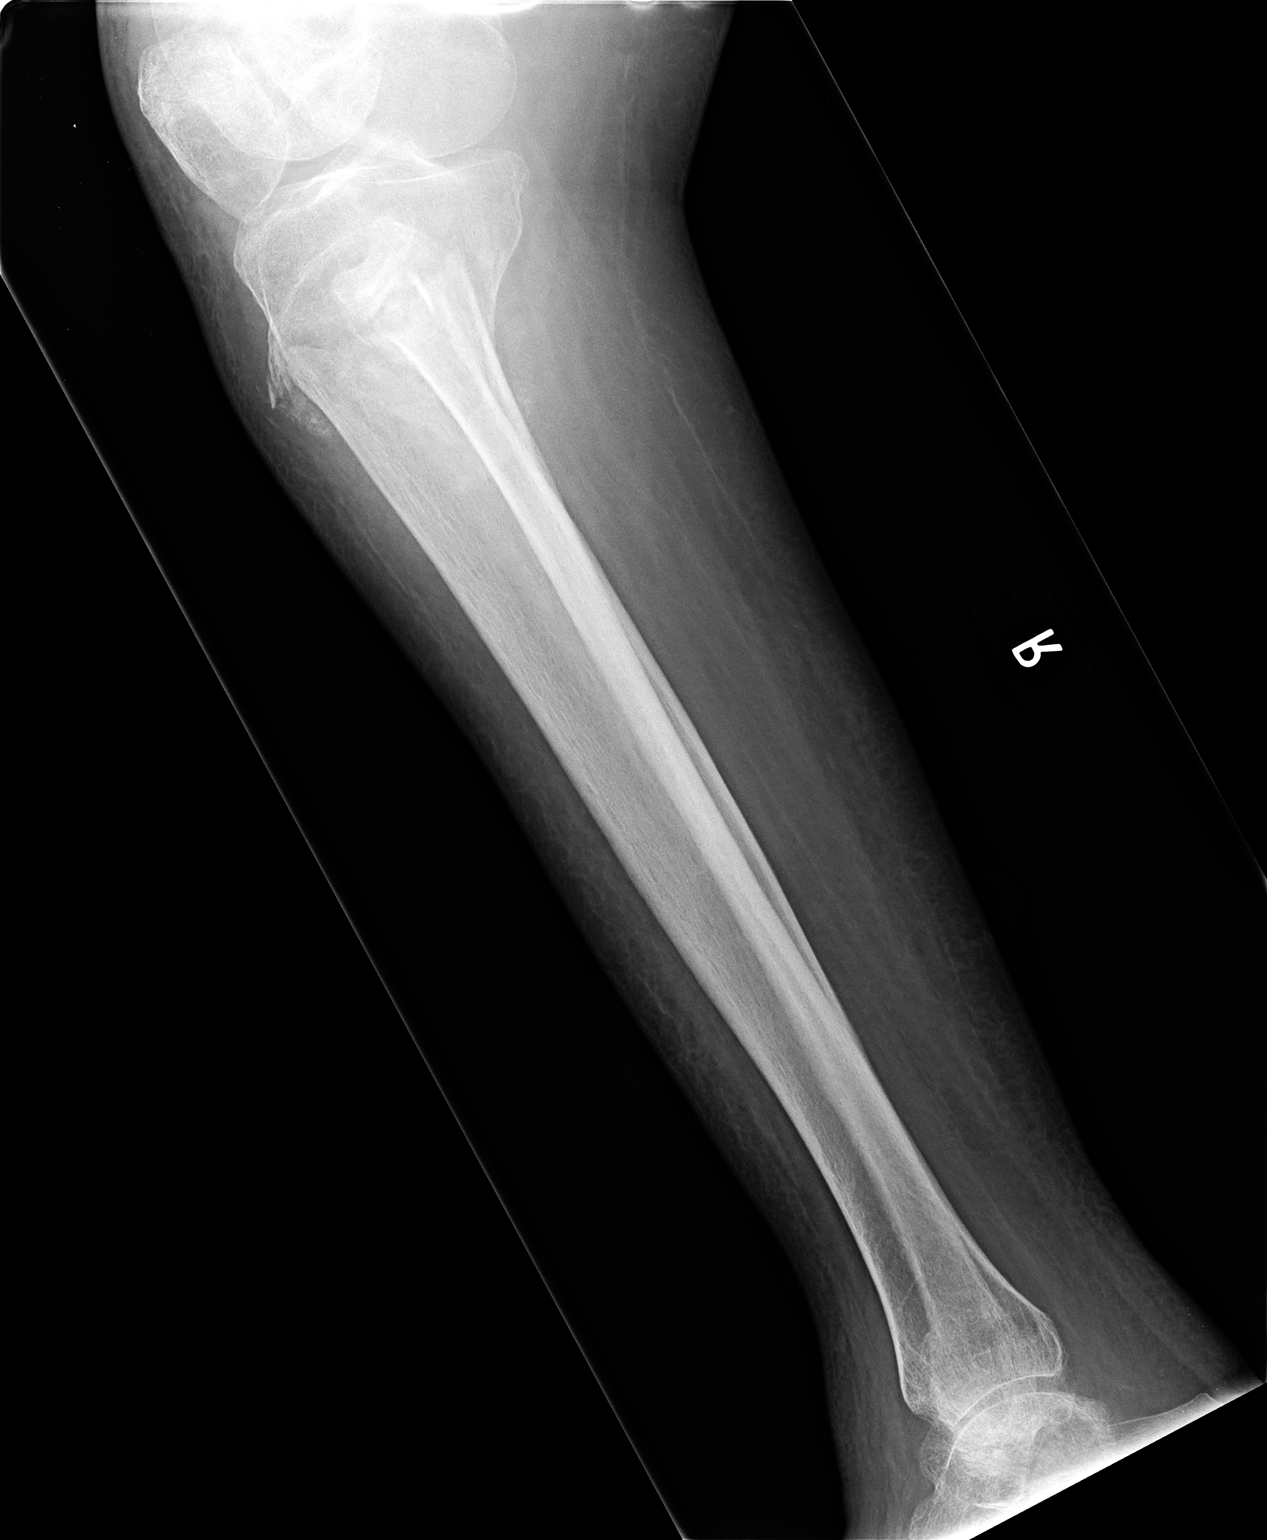

[2 of 2 positions shown; findings below may reference images not displayed]

FINDINGS: There is an acute comminuted intra-articular fracture deformity
involving the proximal right tibia. Fracture line appears to extend
into the medial tibial plateau. Comminuted and slightly impacted
fracture of the proximal fibula also noted.
IMPRESSION: 1. Acute, comminuted intra-articular fracture involves the proximal
right tibia. Suggest further evaluation with CT of the right knee.

## 2017-05-27 ENCOUNTER — Telehealth: Payer: Self-pay | Admitting: Family

## 2017-05-27 IMAGING — CR DG FOOT COMPLETE 3+V*R*
3 series · 3 of 3 positions shown · non-contrast
Comparison: None in PACs

CLINICAL DATA: Quadriplegia; right fourth toe abscess ; limited
positioning possible.

EXAM:
RIGHT FOOT COMPLETE - 3+ VIEW

[view not recorded (1 of 3)]
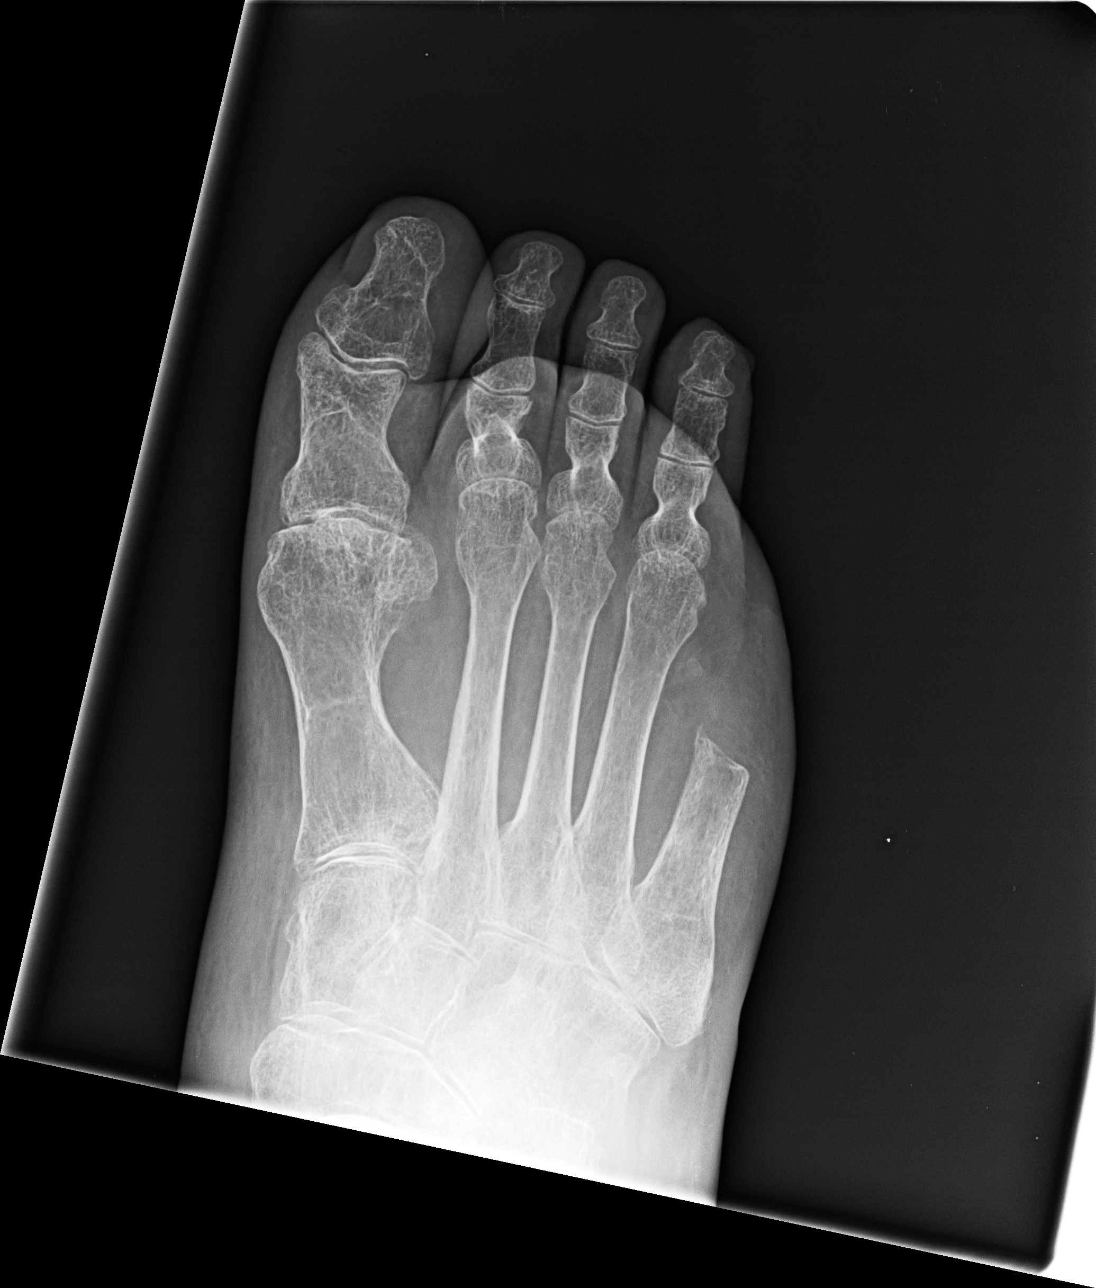

[view not recorded (2 of 3)]
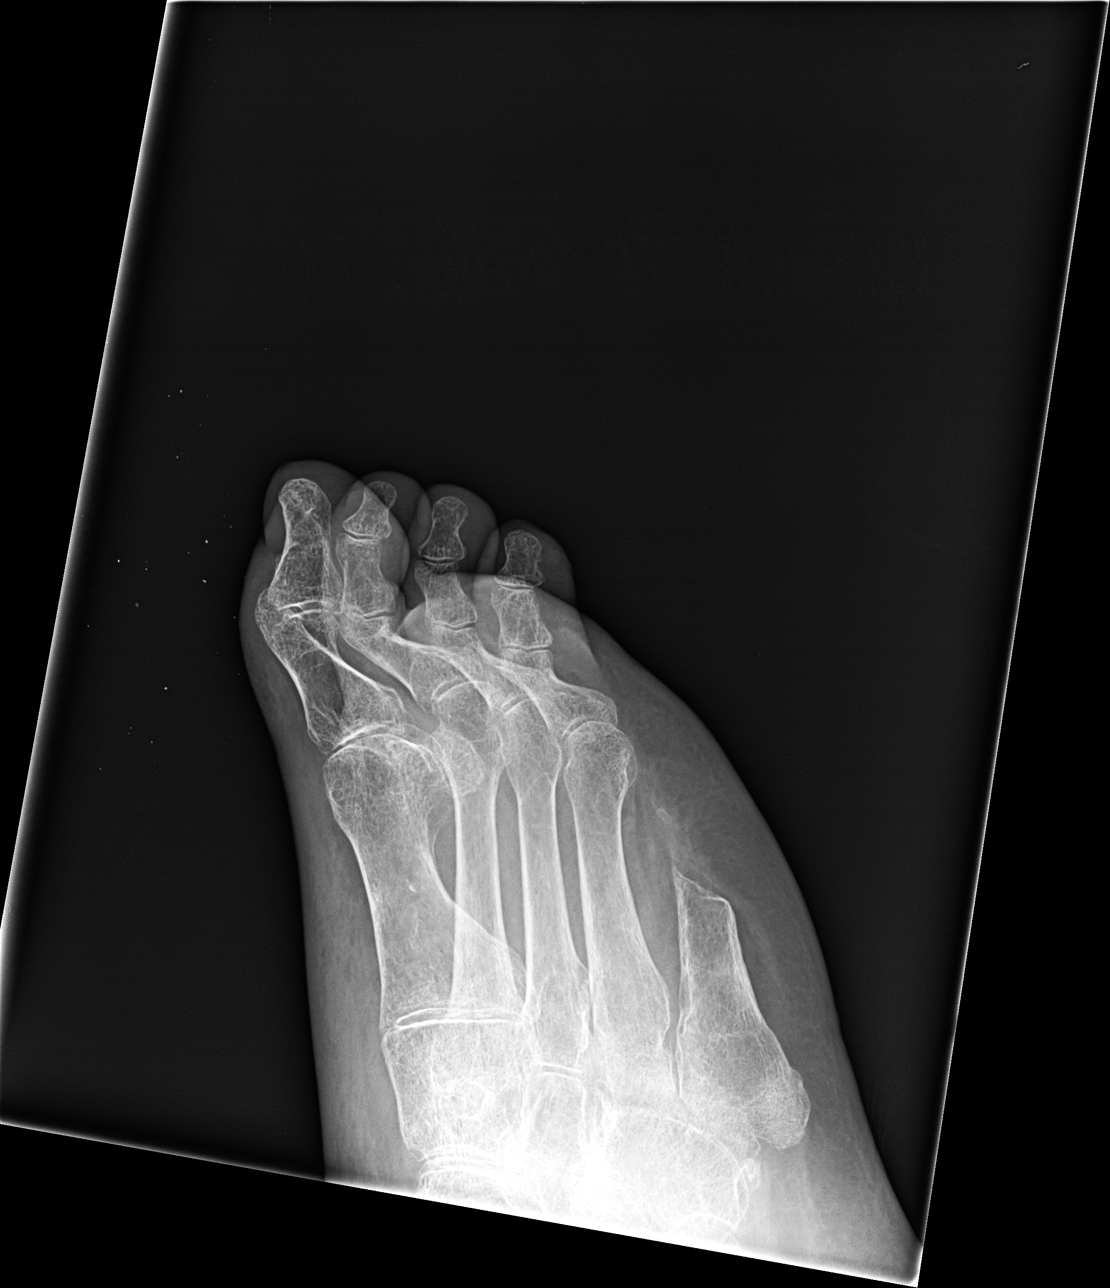

[view not recorded (3 of 3)]
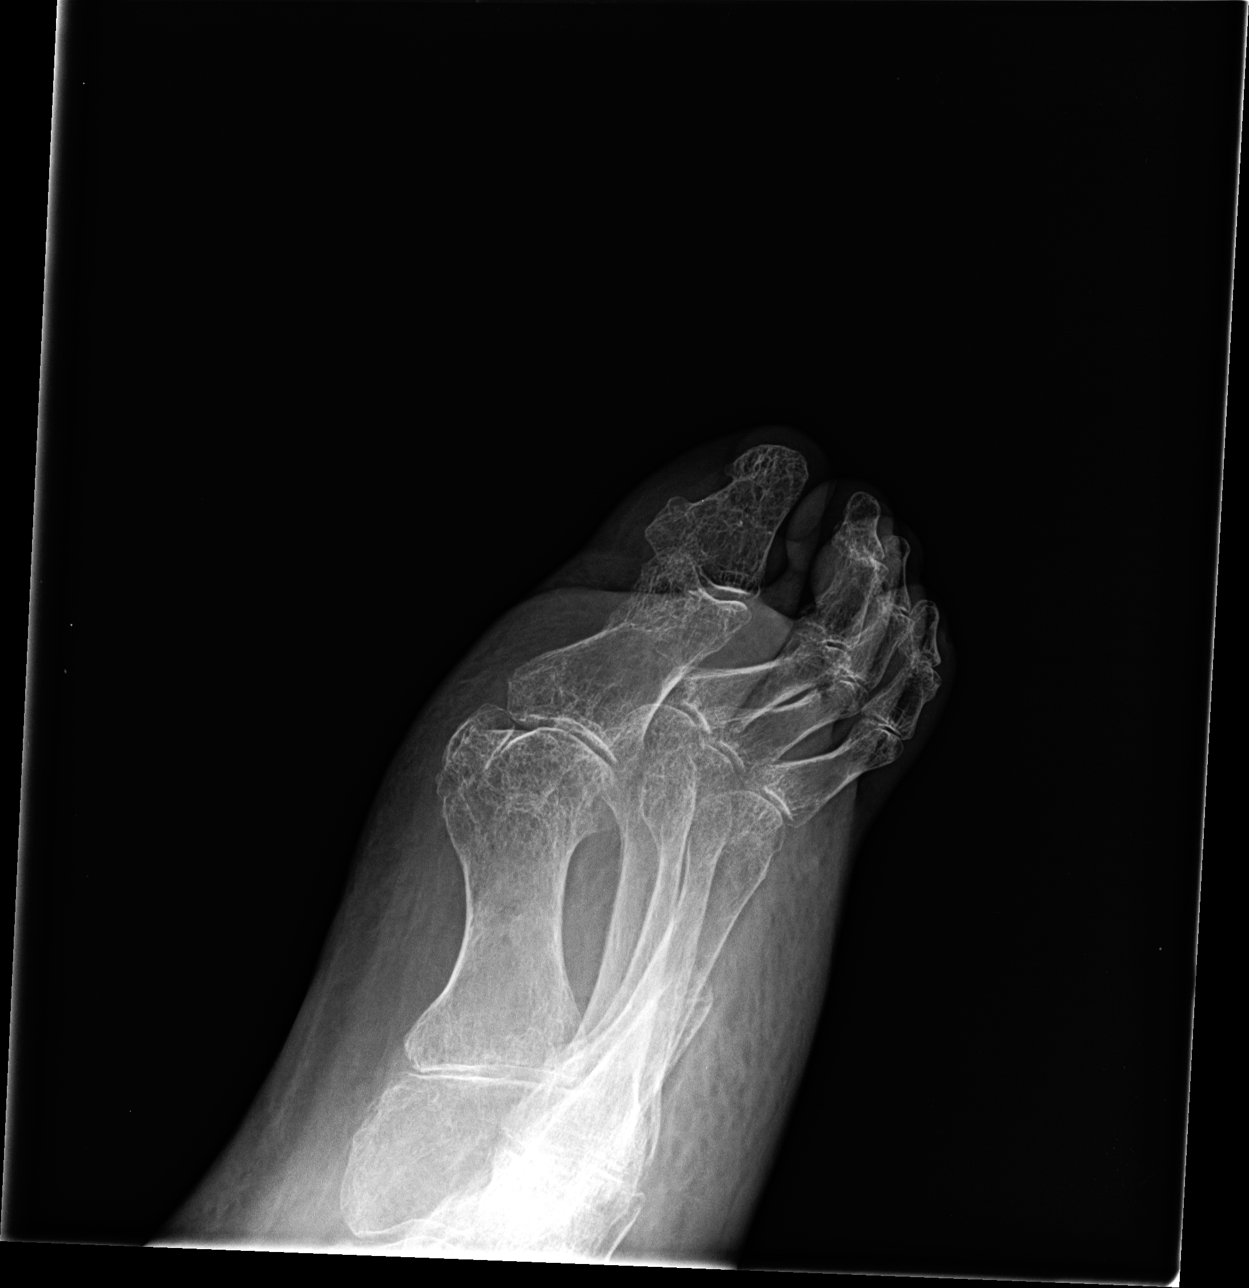

[3 of 3 positions shown; findings below may reference images not displayed]

FINDINGS: The patient has undergone previous amputation of the fifth digit at
the level of the midshaft of the fifth metatarsal. There is thinning
of the soft tissues over the tip of the distal phalanx of the fourth
toe. No underlying bony changes are observed. The first through
third toes appear normal as well. There is degenerative change of
the first metatarsophalangeal joint.
IMPRESSION: There is no radiographic evidence of osteomyelitis of the fourth
toe. There is loss of the soft tissues overlying the tip of the
fourth toe with the tip of the tuft nearly exposed.

## 2017-11-18 DIAGNOSIS — T1490XA Injury, unspecified, initial encounter: Secondary | ICD-10-CM | POA: Diagnosis not present

## 2017-11-20 ENCOUNTER — Ambulatory Visit (INDEPENDENT_AMBULATORY_CARE_PROVIDER_SITE_OTHER): Payer: Medicare Other | Admitting: Family Medicine

## 2017-11-20 ENCOUNTER — Encounter: Payer: Self-pay | Admitting: Family Medicine

## 2017-11-20 VITALS — BP 110/63 | HR 88 | Temp 98.8°F

## 2017-11-20 DIAGNOSIS — B370 Candidal stomatitis: Secondary | ICD-10-CM | POA: Diagnosis not present

## 2017-11-20 DIAGNOSIS — J029 Acute pharyngitis, unspecified: Secondary | ICD-10-CM | POA: Diagnosis not present

## 2017-11-20 LAB — CULTURE, GROUP A STREP

## 2017-11-20 LAB — RAPID STREP SCREEN (MED CTR MEBANE ONLY): Strep Gp A Ag, IA W/Reflex: NEGATIVE

## 2017-11-20 MED ORDER — NYSTATIN 100000 UNIT/ML MT SUSP: 5.0000 mL | Freq: Four times a day (QID) | OROMUCOSAL | 0 refills | Status: AC

## 2017-11-20 NOTE — Progress Notes (Signed)
BP 110/63   Pulse 88   Temp 98.8 F (37.1 C) (Oral)    Subjective:    Patient ID: Jason Clayton, male    DOB: 10/29/1945, 72 y.o.   MRN: [id number]  HPI: Jason Clayton is a 72 y.o. male presenting on 11/20/2017 for Sore Throat (x 2 weeks)   HPI Sore throat and nausea Patient is coming in with complaints of sore throat nausea is been going on for 2 weeks.  He says he just feels raw on the back of his throat has tried to use Cepacol lozenges which helped while he had them but did not help after.  He denies any fevers or chills or cough or congestion.  He says he has some nausea associated with it but denies any vomiting.  He denies any abdominal pain or dysuria.  Patient has a indwelling urinary catheter but denies any burning or pain with urination.  He denies any fevers or chills.  Relevant past medical, surgical, family and social history reviewed and updated as indicated. Interim medical history since our last visit reviewed. Allergies and medications reviewed and updated.  Review of Systems  Constitutional: Negative for chills and fever.  HENT: Positive for sore throat. Negative for congestion, ear discharge, ear pain, sinus pressure, sneezing and tinnitus.   Respiratory: Negative for cough, shortness of breath and wheezing.   Cardiovascular: Negative for chest pain and leg swelling.  Gastrointestinal: Positive for nausea. Negative for abdominal pain, constipation, diarrhea and vomiting.  Genitourinary: Negative for dysuria, flank pain, frequency and hematuria.  Musculoskeletal: Negative for back pain and gait problem.  Skin: Negative for rash.  All other systems reviewed and are negative.   Per HPI unless specifically indicated above   Allergies as of 11/20/2017   No Known Allergies     Medication List        Accurate as of 11/20/17  8:47 AM. Always use your most recent med list.          DIAZEPAM PO Take by mouth.   gabapentin 300 MG capsule Commonly known  as:  NEURONTIN Take 300 mg by mouth 3 (three) times daily.   METHADONE HCL PO Take by mouth 3 (three) times daily.   multivitamin tablet Take 1 tablet by mouth daily.   mupirocin ointment 2 % Commonly known as:  BACTROBAN Apply 1 application topically 2 (two) times daily. To fourth toe   nystatin 100000 UNIT/ML suspension Commonly known as:  MYCOSTATIN Take 5 mLs (500,000 Units total) by mouth 4 (four) times daily.   Omeprazole 20 MG Tbec Take 2 tablets by mouth daily.   propranolol 20 MG tablet Commonly known as:  INDERAL Take 20 mg by mouth 3 (three) times daily.   simethicone 80 MG chewable tablet Commonly known as:  MYLICON Chew 80 mg by mouth every 6 (six) hours as needed for flatulence.   Vitamin D 2000 units Caps Take by mouth.          Objective:    BP 110/63   Pulse 88   Temp 98.8 F (37.1 C) (Oral)   Wt Readings from Last 3 Encounters:  02/23/13 160 lb (72.6 kg)  01/26/13 164 lb (74.4 kg)    Physical Exam  Constitutional: He is oriented to person, place, and time. He appears well-developed and well-nourished. No distress.  HENT:  Mouth/Throat: Uvula is midline. No oral lesions. No uvula swelling. Oropharyngeal exudate (Thrush covering tongue in the sides of his mouth) present. No  posterior oropharyngeal edema or posterior oropharyngeal erythema. No tonsillar exudate.  Eyes: Conjunctivae are normal. Right eye exhibits no discharge. No scleral icterus.  Neck: Neck supple.  Cardiovascular: Normal rate, regular rhythm, normal heart sounds and intact distal pulses.  No murmur heard. Pulmonary/Chest: Effort normal and breath sounds normal. No respiratory distress. He has no wheezes.  Abdominal: Soft. Bowel sounds are normal. He exhibits no distension. There is no tenderness. There is no guarding.  Musculoskeletal: Normal range of motion. He exhibits no edema.  Neurological: He is alert and oriented to person, place, and time. Coordination normal.  Skin:  Skin is warm and dry. No rash noted. He is not diaphoretic.  Psychiatric: He has a normal mood and affect. His behavior is normal.  Nursing note and vitals reviewed.   Rapid strep negative    Assessment & Plan:   Problem List Items Addressed This Visit    None    Visit Diagnoses    Thrush    -  Primary   Relevant Medications   nystatin (MYCOSTATIN) 100000 UNIT/ML suspension   Other Relevant Orders   Rapid Strep Screen ([facility] ONLY)       Follow up plan: Return if symptoms worsen or fail to improve.  Counseling provided for all of the vaccine components Orders Placed This Encounter  Procedures  . Rapid Strep Screen ([facility] ONLY)    Arville CareJoshua Chauntae Hults, MD [facility] 11/20/2017, 8:47 AM

## 2017-11-24 ENCOUNTER — Telehealth: Payer: Self-pay | Admitting: Family

## 2017-11-24 NOTE — Telephone Encounter (Signed)
[email or web address]

## 2017-11-25 ENCOUNTER — Telehealth: Payer: Self-pay | Admitting: Family

## 2017-11-25 MED ORDER — ONDANSETRON 4 MG PO TBDP: 4.0000 mg | ORAL_TABLET | Freq: Three times a day (TID) | ORAL | 0 refills | Status: AC | PRN

## 2017-11-25 MED ORDER — RANITIDINE HCL 150 MG PO TABS: 150.0000 mg | ORAL_TABLET | Freq: Two times a day (BID) | ORAL | 1 refills | Status: AC

## 2017-11-25 NOTE — Telephone Encounter (Signed)
Please tell him that I sent Zofran and Zantac form, if he continues to have issues then he needs to be seen again.

## 2017-11-25 NOTE — Telephone Encounter (Signed)
Called both numbers,  [phone] and [phone].  No answer at one and not able to leave a voice mail on the other.  Zofran and Zantac sent to pharmacy.

## 2017-11-25 NOTE — Telephone Encounter (Signed)
Please advise if another medicine will be sent to pharmacy for patient.

## 2017-11-25 NOTE — Telephone Encounter (Signed)
Zofran and zantac sent in.

## 2017-12-01 DIAGNOSIS — R0689 Other abnormalities of breathing: Secondary | ICD-10-CM | POA: Diagnosis not present

## 2017-12-01 DIAGNOSIS — R0902 Hypoxemia: Secondary | ICD-10-CM | POA: Diagnosis not present

## 2017-12-01 DIAGNOSIS — R4020 Unspecified coma: Secondary | ICD-10-CM | POA: Diagnosis not present

## 2017-12-01 DIAGNOSIS — J80 Acute respiratory distress syndrome: Secondary | ICD-10-CM | POA: Diagnosis not present

## 2017-12-01 DIAGNOSIS — R404 Transient alteration of awareness: Secondary | ICD-10-CM | POA: Diagnosis not present

## 2017-12-03 ENCOUNTER — Other Ambulatory Visit: Payer: Self-pay | Admitting: Family

## 2017-12-07 DEATH — deceased
